# Patient Record
Sex: Male | Born: 1980 | Race: White | Hispanic: No | Marital: Single | Smoking: Current every day smoker
Health system: Southern US, Community
[De-identification: ages and names within clinical notes are randomized; demographics above are authoritative.]

## PROBLEM LIST (undated history)

## (undated) ENCOUNTER — Emergency Department (HOSPITAL_COMMUNITY): Admission: EM | Payer: Self-pay | Source: Home / Self Care

## (undated) DIAGNOSIS — F329 Major depressive disorder, single episode, unspecified: Secondary | ICD-10-CM

## (undated) DIAGNOSIS — F32A Depression, unspecified: Secondary | ICD-10-CM

## (undated) HISTORY — PX: BRAIN TUMOR EXCISION: SHX577

---

## 2005-05-08 ENCOUNTER — Emergency Department (HOSPITAL_COMMUNITY): Admission: EM | Admit: 2005-05-08 | Discharge: 2005-05-08 | Payer: Self-pay | Admitting: Emergency Medicine

## 2005-10-23 ENCOUNTER — Emergency Department (HOSPITAL_COMMUNITY): Admission: EM | Admit: 2005-10-23 | Discharge: 2005-10-23 | Payer: Self-pay | Admitting: Emergency Medicine

## 2006-02-01 ENCOUNTER — Emergency Department (HOSPITAL_COMMUNITY): Admission: EM | Admit: 2006-02-01 | Discharge: 2006-02-02 | Payer: Self-pay | Admitting: Emergency Medicine

## 2006-02-09 ENCOUNTER — Emergency Department (HOSPITAL_COMMUNITY): Admission: EM | Admit: 2006-02-09 | Discharge: 2006-02-09 | Payer: Self-pay | Admitting: Emergency Medicine

## 2006-02-12 ENCOUNTER — Emergency Department (HOSPITAL_COMMUNITY): Admission: EM | Admit: 2006-02-12 | Discharge: 2006-02-12 | Payer: Self-pay | Admitting: Emergency Medicine

## 2006-07-04 ENCOUNTER — Emergency Department (HOSPITAL_COMMUNITY): Admission: EM | Admit: 2006-07-04 | Discharge: 2006-07-05 | Payer: Self-pay | Admitting: Emergency Medicine

## 2006-07-05 ENCOUNTER — Emergency Department (HOSPITAL_COMMUNITY): Admission: EM | Admit: 2006-07-05 | Discharge: 2006-07-05 | Payer: Self-pay | Admitting: Emergency Medicine

## 2006-09-08 ENCOUNTER — Emergency Department (HOSPITAL_COMMUNITY): Admission: EM | Admit: 2006-09-08 | Discharge: 2006-09-08 | Payer: Self-pay | Admitting: Emergency Medicine

## 2010-12-23 LAB — CBC
Hemoglobin: 16.9
MCHC: 34.7
MCV: 91.4
RDW: 13.1

## 2010-12-23 LAB — BASIC METABOLIC PANEL
CO2: 26
Calcium: 9.1
Creatinine, Ser: 1.38
Glucose, Bld: 121 — ABNORMAL HIGH
Sodium: 138

## 2010-12-23 LAB — DIFFERENTIAL
Basophils Absolute: 0
Basophils Relative: 0
Eosinophils Absolute: 0.2
Monocytes Absolute: 0.7
Neutro Abs: 5.5

## 2010-12-23 LAB — RAPID URINE DRUG SCREEN, HOSP PERFORMED: Cocaine: POSITIVE — AB

## 2014-07-29 ENCOUNTER — Emergency Department (HOSPITAL_COMMUNITY)
Admission: EM | Admit: 2014-07-29 | Discharge: 2014-07-29 | Disposition: A | Discharge status: Home / Self Care | Payer: Self-pay | Attending: Emergency Medicine | Admitting: Emergency Medicine

## 2014-07-29 ENCOUNTER — Encounter (HOSPITAL_COMMUNITY): Payer: Self-pay | Admitting: Emergency Medicine

## 2014-07-29 ENCOUNTER — Emergency Department (HOSPITAL_COMMUNITY): Payer: Self-pay

## 2014-07-29 DIAGNOSIS — I10 Essential (primary) hypertension: Secondary | ICD-10-CM | POA: Insufficient documentation

## 2014-07-29 DIAGNOSIS — F419 Anxiety disorder, unspecified: Secondary | ICD-10-CM | POA: Insufficient documentation

## 2014-07-29 DIAGNOSIS — G479 Sleep disorder, unspecified: Secondary | ICD-10-CM | POA: Insufficient documentation

## 2014-07-29 DIAGNOSIS — Z720 Tobacco use: Secondary | ICD-10-CM | POA: Insufficient documentation

## 2014-07-29 LAB — RAPID URINE DRUG SCREEN, HOSP PERFORMED
Amphetamines: POSITIVE — AB
Barbiturates: NOT DETECTED
Benzodiazepines: NOT DETECTED
Cocaine: NOT DETECTED
OPIATES: NOT DETECTED
Tetrahydrocannabinol: NOT DETECTED

## 2014-07-29 LAB — CBC
HCT: 45.7 % (ref 39.0–52.0)
Hemoglobin: 15.8 g/dL (ref 13.0–17.0)
MCH: 30.9 pg (ref 26.0–34.0)
MCHC: 34.6 g/dL (ref 30.0–36.0)
MCV: 89.4 fL (ref 78.0–100.0)
Platelets: 284 10*3/uL (ref 150–400)
RBC: 5.11 MIL/uL (ref 4.22–5.81)
RDW: 12.1 % (ref 11.5–15.5)
WBC: 10.9 10*3/uL — AB (ref 4.0–10.5)

## 2014-07-29 LAB — URINALYSIS, ROUTINE W REFLEX MICROSCOPIC
BILIRUBIN URINE: NEGATIVE
Glucose, UA: NEGATIVE mg/dL
HGB URINE DIPSTICK: NEGATIVE
Ketones, ur: NEGATIVE mg/dL
LEUKOCYTES UA: NEGATIVE
NITRITE: NEGATIVE
PH: 6 (ref 5.0–8.0)
PROTEIN: NEGATIVE mg/dL
Specific Gravity, Urine: 1.017 (ref 1.005–1.030)
Urobilinogen, UA: 0.2 mg/dL (ref 0.0–1.0)

## 2014-07-29 LAB — BASIC METABOLIC PANEL
Anion gap: 8 (ref 5–15)
BUN: 12 mg/dL (ref 6–20)
CALCIUM: 9.2 mg/dL (ref 8.9–10.3)
CHLORIDE: 105 mmol/L (ref 101–111)
CO2: 24 mmol/L (ref 22–32)
CREATININE: 1.05 mg/dL (ref 0.61–1.24)
GFR calc non Af Amer: >60 mL/min (ref >60)
Glucose, Bld: 112 mg/dL — ABNORMAL HIGH (ref 65–99)
Potassium: 4.1 mmol/L (ref 3.5–5.1)
SODIUM: 137 mmol/L (ref 135–145)

## 2014-07-29 MED ORDER — LORAZEPAM 1 MG PO TABS
0.5000 mg | ORAL_TABLET | Freq: Once | ORAL | Status: AC
  Administered 2014-07-29: 0.5 mg via ORAL
  Filled 2014-07-29: qty 1

## 2014-07-29 NOTE — ED Notes (Signed)
Pt c/o feeling shaky past few days and BP is 148/107. Pt has not been diagnosed with hypertension. Pt has been under recent stress recently.

## 2014-07-29 NOTE — ED Provider Notes (Signed)
CSN: [id number]     Arrival date & time 07/29/14  1444 History   First MD Initiated Contact with Patient 07/29/14 1625     Chief Complaint  Patient presents with  . Shaking  . Hypertension     (Consider location/radiation/quality/duration/timing/severity/associated sxs/prior Treatment) HPI Comments: 3 day history of shaking in arms and legs and feeling anxious.  Patient went to [facility] and checked his blood pressure and it was 148/107. He denies any history of high blood pressure. He denies any headache, vision change, chest pain, shortness of breath. No nausea or vomiting. He says is been on a lot of stress recently and feeling anxious. Denies any suicidal or homicidal thoughts. Denies any medication or drug use. States he works in [employer] and drinks caffeine "nonstop". He does complain of pressure between his eyes for the past several days and nasal congestion. Denies any alcohol use.  Patient is a 34 y.o. male presenting with hypertension. The history is provided by the patient.  Hypertension Pertinent negatives include no chest pain, no abdominal pain and no shortness of breath.    History reviewed. No pertinent past medical history. History reviewed. No pertinent past surgical history. No family history on file. History  Substance Use Topics  . Smoking status: Current Every Day Smoker  . Smokeless tobacco: Not on file  . Alcohol Use: No    Review of Systems  Constitutional: Negative for fever, activity change and appetite change.  HENT: Negative for congestion and rhinorrhea.   Respiratory: Negative for cough, chest tightness and shortness of breath.   Cardiovascular: Negative for chest pain.  Gastrointestinal: Negative for nausea, vomiting and abdominal pain.  Genitourinary: Negative for dysuria, hematuria and testicular pain.  Musculoskeletal: Negative for myalgias and arthralgias.  Skin: Negative for wound.  Psychiatric/Behavioral: Positive for sleep disturbance,  dysphoric mood and decreased concentration. The patient is nervous/anxious.   A complete 10 system review of systems was obtained and all systems are negative except as noted in the HPI and PMH.      Allergies  Review of patient's allergies indicates no known allergies.  Home Medications   Prior to Admission medications   Medication Sig Start Date End Date Taking? Authorizing Provider  ibuprofen (ADVIL,MOTRIN) 200 MG tablet Take 600 mg by mouth every 6 (six) hours as needed for mild pain or moderate pain.   Yes Historical Provider, MD   BP 111/45 mmHg  Pulse 91  Temp(Src) 98.4 F (36.9 C) (Oral)  Resp 16  Ht  (1.88 m)  Wt 290 lb (131.543 kg)  BMI 37.22 kg/m2  SpO2 98% Physical Exam  Constitutional: He is oriented to person, place, and time. He appears well-developed and well-nourished. No distress.  Appears anxious, mildly tremulous.  HENT:  Head: Normocephalic and atraumatic.  Mouth/Throat: Oropharynx is clear and moist. No oropharyngeal exudate.  Eyes: Conjunctivae and EOM are normal. Pupils are equal, round, and reactive to light.  Neck: Normal range of motion. Neck supple.  No meningismus.  Cardiovascular: Normal rate, regular rhythm, normal heart sounds and intact distal pulses.   No murmur heard. Pulmonary/Chest: Effort normal and breath sounds normal. No respiratory distress.  Abdominal: Soft. There is no tenderness. There is no rebound and no guarding.  Musculoskeletal: Normal range of motion. He exhibits no edema or tenderness.  Neurological: He is alert and oriented to person, place, and time. No cranial nerve deficit. He exhibits normal muscle tone. Coordination normal.  No ataxia on finger to nose bilaterally. No  pronator drift. 5/5 strength throughout. CN 2-12 intact. Negative Romberg. Equal grip strength. Sensation intact. Gait is normal.   Skin: Skin is warm.  Psychiatric: He has a normal mood and affect. His behavior is normal.  Nursing note and vitals  reviewed.   ED Course  Procedures (including critical care time) Labs Review Labs Reviewed  CBC - Abnormal; Notable for the following:    WBC 10.9 (*)    All other components within normal limits  BASIC METABOLIC PANEL - Abnormal; Notable for the following:    Glucose, Bld 112 (*)    All other components within normal limits  URINE RAPID DRUG SCREEN (HOSP PERFORMED) - Abnormal; Notable for the following:    Amphetamines POSITIVE (*)    All other components within normal limits  URINALYSIS, ROUTINE W REFLEX MICROSCOPIC    Imaging Review Dg Chest 2 View  07/29/2014   CLINICAL DATA:  Bilateral hand shakiness, hypertension  EXAM: CHEST  2 VIEW  COMPARISON:  07/13/2014  FINDINGS: Lungs are clear.  No pleural effusion or pneumothorax.  The heart is normal in size.  Visualized osseous structures are within normal limits.  IMPRESSION: Normal chest radiographs.   Electronically Signed   By: Charline BillsSriyesh  Krishnan M.D.   On: 07/29/2014 17:28     EKG Interpretation   Date/Time:  Sunday Jul 29 2014 16:33:33 EDT Ventricular Rate:  87 PR Interval:  141 QRS Duration: 95 QT Interval:  376 QTC Calculation: 452 R Axis:   41 Text Interpretation:  Sinus rhythm No previous ECGs available Confirmed by  Braileigh Landenberger  MD, Samyak Sackmann [phone]([id number] on 07/29/2014 4:52:29 PM      MDM   Final diagnoses:  Anxiety   patient was shaking hands and feet and elevated blood pressure. No chest pain or shortness of breath. No headache.  Labs unremarkable. Chest x-ray negative. EKG normal sinus rhythm.  Patient feels improved after Ativan in the ED. Denies any SI or HI. UDS positive for amphetamines which he denies.  Instructed to gradually decrease his caffeine use. Needs PCP follow-up if anxiety continues to be an ongoing issue.   Glynn OctaveStephen Edel Rivero, MD 07/30/14 [phone]

## 2014-07-29 NOTE — Discharge Instructions (Signed)
Generalized Anxiety Disorder Decrease your caffeine use gradually. Follow up with your doctor. Return to the ED if you develop new or worsening symptoms. Generalized anxiety disorder (GAD) is a mental disorder. It interferes with life functions, including relationships, work, and school. GAD is different from normal anxiety, which everyone experiences at some point in their lives in response to specific life events and activities. Normal anxiety actually helps us prepare for and get through these life events and activities. Normal anxiety goes away after the event or activity is over.  GAD causes anxiety that is not necessarily related to specific events or activities. It also causes excess anxiety in proportion to specific events or activities. The anxiety associated with GAD is also difficult to control. GAD can vary from mild to severe. People with severe GAD can have intense waves of anxiety with physical symptoms (panic attacks).  SYMPTOMS The anxiety and worry associated with GAD are difficult to control. This anxiety and worry are related to many life events and activities and also occur more days than not for 6 months or longer. People with GAD also have three or more of the following symptoms (one or more in children):  Restlessness.   Fatigue.  Difficulty concentrating.   Irritability.  Muscle tension.  Difficulty sleeping or unsatisfying sleep. DIAGNOSIS GAD is diagnosed through an assessment by your health care provider. Your health care provider will ask you questions aboutyour mood,physical symptoms, and events in your life. Your health care provider may ask you about your medical history and use of alcohol or drugs, including prescription medicines. Your health care provider may also do a physical exam and blood tests. Certain medical conditions and the use of certain substances can cause symptoms similar to those associated with GAD. Your health care provider may refer you to  a mental health specialist for further evaluation. TREATMENT The following therapies are usually used to treat GAD:   Medication. Antidepressant medication usually is prescribed for long-term daily control. Antianxiety medicines may be added in severe cases, especially when panic attacks occur.   Talk therapy (psychotherapy). Certain types of talk therapy can be helpful in treating GAD by providing support, education, and guidance. A form of talk therapy called cognitive behavioral therapy can teach you healthy ways to think about and react to daily life events and activities.  Stress managementtechniques. These include yoga, meditation, and exercise and can be very helpful when they are practiced regularly. A mental health specialist can help determine which treatment is best for you. Some people see improvement with one therapy. However, other people require a combination of therapies. Document Released: 06/20/2012 Document Revised: 07/10/2013 Document Reviewed: 06/20/2012 [facility] Patient Information 2015 [place], [place]. This information is not intended to replace advice given to you by your health care provider. Make sure you discuss any questions you have with your health care provider.

## 2014-07-30 ENCOUNTER — Emergency Department (HOSPITAL_COMMUNITY)
Admission: EM | Admit: 2014-07-30 | Discharge: 2014-07-30 | Disposition: A | Discharge status: Home / Self Care | Payer: Self-pay | Attending: Emergency Medicine | Admitting: Emergency Medicine

## 2014-07-30 ENCOUNTER — Encounter (HOSPITAL_COMMUNITY): Payer: Self-pay | Admitting: Family Medicine

## 2014-07-30 DIAGNOSIS — F419 Anxiety disorder, unspecified: Secondary | ICD-10-CM | POA: Insufficient documentation

## 2014-07-30 DIAGNOSIS — R251 Tremor, unspecified: Secondary | ICD-10-CM | POA: Insufficient documentation

## 2014-07-30 DIAGNOSIS — Z720 Tobacco use: Secondary | ICD-10-CM | POA: Insufficient documentation

## 2014-07-30 LAB — TSH: TSH: 1.038 u[IU]/mL (ref 0.350–4.500)

## 2014-07-30 MED ORDER — LORAZEPAM 0.5 MG PO TABS: 0.5000 mg | ORAL_TABLET | Freq: Two times a day (BID) | ORAL | Status: DC | PRN

## 2014-07-30 MED ORDER — LORAZEPAM 1 MG PO TABS
1.0000 mg | ORAL_TABLET | Freq: Once | ORAL | Status: AC
  Administered 2014-07-30: 1 mg via ORAL
  Filled 2014-07-30: qty 1

## 2014-07-30 NOTE — ED Provider Notes (Signed)
CSN: [id number]     Arrival date & time 07/30/14  1329 History   First MD Initiated Contact with Patient 07/30/14 1508     Chief Complaint  Patient presents with  . Anxiety     (Consider location/radiation/quality/duration/timing/severity/associated sxs/prior Treatment) HPI Comments: 34 year old male comes with complaints of anxiety. Was seen yesterday for similar. Had fairly extensive workup at that time. Determined patient with no organic issues. Given Ativan and improved. Returns today with similar. Points of generalized shaking and feeling of anxiety for several hours. His been constant and unchanged. He is taking no medications for this. Yesterday had a UDS which showed amphetamines. He endorses taking over-the-counter cold medications but adamantly denies any illegal drug use.  Patient is a 34 y.o. male presenting with anxiety.  Anxiety This is a new problem. The current episode started yesterday. The problem occurs daily. The problem has been unchanged. Pertinent negatives include no abdominal pain, chest pain, congestion, fever, headaches or rash. Nothing aggravates the symptoms. He has tried nothing for the symptoms.    History reviewed. No pertinent past medical history. History reviewed. No pertinent past surgical history. History reviewed. No pertinent family history. History  Substance Use Topics  . Smoking status: Current Every Day Smoker  . Smokeless tobacco: Not on file  . Alcohol Use: No    Review of Systems  Constitutional: Negative for fever.  HENT: Negative for congestion.   Respiratory: Negative for shortness of breath.   Cardiovascular: Negative for chest pain.  Gastrointestinal: Negative for abdominal pain.  Genitourinary: Negative for dysuria.  Skin: Negative for rash.  Neurological: Negative for headaches.  Psychiatric/Behavioral: The patient is nervous/anxious.   All other systems reviewed and are negative.     Allergies  Review of patient's  allergies indicates no known allergies.  Home Medications   Prior to Admission medications   Medication Sig Start Date End Date Taking? Authorizing Provider  ibuprofen (ADVIL,MOTRIN) 200 MG tablet Take 600 mg by mouth every 6 (six) hours as needed for mild pain or moderate pain.   Yes Historical Provider, MD  LORazepam (ATIVAN) 0.5 MG tablet Take 1 tablet (0.5 mg total) by mouth 2 (two) times daily as needed for anxiety. 07/30/14   Bridgett Larssonhris Zade Falkner, MD   BP 123/66 mmHg  Pulse 72  Temp(Src) 98.7 F (37.1 C) (Oral)  Resp 18  SpO2 95% Physical Exam  Constitutional: He appears well-developed.  HENT:  Head: Normocephalic and atraumatic.  Eyes: Pupils are equal, round, and reactive to light.  4mm reactive   Neck: Normal range of motion.  Cardiovascular:  Heart rate 90 similar exam. No murmurs rubs gallops  Pulmonary/Chest: Effort normal. No stridor. No respiratory distress.  Abdominal: Soft. He exhibits no distension. There is no tenderness.  Musculoskeletal: He exhibits no tenderness.  Neurological: No cranial nerve deficit. He exhibits normal muscle tone.  Slightly tremulous throughout bilateral upper extremities. GCS 15. Normal coordination. Cranial nerves normal.  Skin: Skin is warm.  Psychiatric: He has a normal mood and affect.  Endorses anxiety   Vitals reviewed.   ED Course  Procedures (including critical care time) Labs Review Labs Reviewed  TSH    Imaging Review Dg Chest 2 View  07/29/2014   CLINICAL DATA:  Bilateral hand shakiness, hypertension  EXAM: CHEST  2 VIEW  COMPARISON:  07/13/2014  FINDINGS: Lungs are clear.  No pleural effusion or pneumothorax.  The heart is normal in size.  Visualized osseous structures are within normal limits.  IMPRESSION: Normal chest  radiographs.   Electronically Signed   By: Charline Bills M.D.   On: 07/29/2014 17:28     EKG Interpretation None      MDM  34 year old male return visit from yesterday. Complaining of anxiety and  shaking. Patient on exam is not tachycardic or hypertensive. He has no concerning physical exam findings. He had a full workup yesterday which ruled out most organic causes of this. They did not obtain thyroid studies. TSH was obtained today and normal. He was given 0.5 mg Ativan by mouth with good resolution. Patient's anxiety and shakiness likely secondary to abrupt caffeine withdrawal. States he drank multiple energy drinks, soda and coffee. Had abruptly stopped possibly 48 hours ago. We have encouraged him to taper this slowly. He does have appropriate follow-up in 3 days to establish new PCP. UDS yesterday did show amphetamines however he endorses taking pseudoephedrine. He denies any illegal drug use. will provide several days of twice a day 0.5 mg Ativan.   Final diagnoses:  Anxiety        Bridgett Larsson, MD 07/30/14 1717  Nelva Nay, MD 07/30/14 [phone]

## 2014-07-30 NOTE — ED Notes (Signed)
Pt sts here for anxiety yesterday. sts he was given medication here and felt better. sts the anxiety returned today. sts he tried to contact the referral and he couldn't get an appt for a few days. sts he feels shaky

## 2014-09-08 ENCOUNTER — Emergency Department (HOSPITAL_COMMUNITY)
Admission: EM | Admit: 2014-09-08 | Discharge: 2014-09-08 | Discharge status: Against Medical Advice | Payer: Self-pay | Attending: Emergency Medicine | Admitting: Emergency Medicine

## 2014-09-08 ENCOUNTER — Encounter (HOSPITAL_COMMUNITY): Payer: Self-pay | Admitting: Emergency Medicine

## 2014-09-08 DIAGNOSIS — Z720 Tobacco use: Secondary | ICD-10-CM | POA: Insufficient documentation

## 2014-09-08 DIAGNOSIS — F329 Major depressive disorder, single episode, unspecified: Secondary | ICD-10-CM | POA: Insufficient documentation

## 2014-09-08 DIAGNOSIS — F32A Depression, unspecified: Secondary | ICD-10-CM

## 2014-09-08 HISTORY — DX: Major depressive disorder, single episode, unspecified: F32.9

## 2014-09-08 HISTORY — DX: Depression, unspecified: F32.A

## 2014-09-08 NOTE — ED Notes (Addendum)
Awake. Verbally responsive. Resp even and unlabored. ABC's intact. No behavior problems noted. NAD noted. Pt given breakfast tray and eaten 90% of meal.
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
## 2014-09-08 NOTE — ED Notes (Signed)
Bed: WA30 Expected date:  Expected time:  Means of arrival:  Comments: 

## 2014-09-08 NOTE — ED Notes (Signed)
Awake, watching TV. Verbally responsive. Resp even and unlabored. ABC's intact. No behavior problems noted. NAD noted.

## 2014-09-08 NOTE — ED Notes (Signed)
Awake. Verbally responsive. Resp even and unlabored. ABC's intact. No behavior problems noted. NAD noted.

## 2014-09-08 NOTE — ED Notes (Addendum)
In room to check pt and pt was not able to be found.

## 2014-09-08 NOTE — ED Notes (Addendum)
Pt reported having symptoms of depression and insomnia x1 week. Pt reported that he was advise to stopped taking Prozac medication x1 month ago after taking for 675yrs. Pt resides in [facility] and receiving NA services. Pt denies SI/HI.

## 2014-09-08 NOTE — ED Notes (Signed)
Dr Cook at bedside

## 2014-09-08 NOTE — ED Provider Notes (Signed)
CSN: [id number]     Arrival date & time 09/08/14  0707 History   First MD Initiated Contact with Patient 09/08/14 [phone]     Chief Complaint  Patient presents with  . Depression     (Consider location/radiation/quality/duration/timing/severity/associated sxs/prior Treatment) HPI..... Patient complains of depression for many years. He had been on Prozac, but this was discontinued recently after 8 years. He has been sleeping poorly. 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 He is not taking any drugs. No alcohol. No suicidal or homicidal ideation.  Past Medical History  Diagnosis Date  . Depression    History reviewed. No pertinent past surgical history. No family history on file. History  Substance Use Topics  . Smoking status: Current Every Day Smoker  . Smokeless tobacco: Not on file  . Alcohol Use: No    Review of Systems  All other systems reviewed and are negative.     Allergies  Review of patient's allergies indicates no known allergies.  Home Medications   Prior to Admission medications   Medication Sig Start Date End Date Taking? Authorizing Provider  LORazepam (ATIVAN) 0.5 MG tablet Take 1 tablet (0.5 mg total) by mouth 2 (two) times daily as needed for anxiety. Patient not taking: Reported on 09/08/2014 07/30/14   Bridgett Larssonhris Post, MD   BP 130/76 mmHg  Pulse 106  Temp(Src) 98 F (36.7 C) (Oral)  Resp 18  SpO2 99% Physical Exam  Constitutional: He is oriented to person, place, and time. He appears well-developed and well-nourished.  HENT:  Head: Normocephalic and atraumatic.  Eyes: Conjunctivae and EOM are normal. Pupils are equal, round, and reactive to light.  Neck: Normal range of motion. Neck supple.  Cardiovascular: Normal rate and regular rhythm.   Pulmonary/Chest: Effort normal and breath sounds normal.  Abdominal: Soft. Bowel sounds are normal.  Musculoskeletal: Normal range of motion.  Neurological: He is alert and  oriented to person, place, and time.  Skin: Skin is warm and dry.  Psychiatric:  Flat affect; depressed  Nursing note and vitals reviewed.   ED Course  Procedures (including critical care time) Labs Review Labs Reviewed - No data to display  Imaging Review No results found.   EKG Interpretation None      MDM   Final diagnoses:  Depression    Patient is depressed, but not psychotic. No suicidal or homicidal ideation. Will consult behavioral health.    Donnetta HutchingBrian Esmeralda Blanford, MD 09/08/14 1014

## 2014-10-17 ENCOUNTER — Emergency Department (HOSPITAL_COMMUNITY)
Admission: EM | Admit: 2014-10-17 | Discharge: 2014-10-17 | Disposition: A | Discharge status: Home / Self Care | Payer: Self-pay | Attending: Emergency Medicine | Admitting: Emergency Medicine

## 2014-10-17 ENCOUNTER — Encounter (HOSPITAL_COMMUNITY): Payer: Self-pay | Admitting: Emergency Medicine

## 2014-10-17 ENCOUNTER — Emergency Department (HOSPITAL_COMMUNITY)
Admission: EM | Admit: 2014-10-17 | Discharge: 2014-10-18 | Disposition: A | Discharge status: Home / Self Care | Payer: Self-pay | Attending: Emergency Medicine | Admitting: Emergency Medicine

## 2014-10-17 ENCOUNTER — Encounter (HOSPITAL_COMMUNITY): Payer: Self-pay | Admitting: *Deleted

## 2014-10-17 DIAGNOSIS — M542 Cervicalgia: Secondary | ICD-10-CM | POA: Insufficient documentation

## 2014-10-17 DIAGNOSIS — T8484XA Pain due to internal orthopedic prosthetic devices, implants and grafts, initial encounter: Secondary | ICD-10-CM | POA: Insufficient documentation

## 2014-10-17 DIAGNOSIS — R062 Wheezing: Secondary | ICD-10-CM | POA: Insufficient documentation

## 2014-10-17 DIAGNOSIS — F419 Anxiety disorder, unspecified: Secondary | ICD-10-CM | POA: Insufficient documentation

## 2014-10-17 DIAGNOSIS — R6883 Chills (without fever): Secondary | ICD-10-CM | POA: Insufficient documentation

## 2014-10-17 DIAGNOSIS — R51 Headache: Secondary | ICD-10-CM | POA: Insufficient documentation

## 2014-10-17 DIAGNOSIS — Z8659 Personal history of other mental and behavioral disorders: Secondary | ICD-10-CM | POA: Insufficient documentation

## 2014-10-17 DIAGNOSIS — Z720 Tobacco use: Secondary | ICD-10-CM | POA: Insufficient documentation

## 2014-10-17 DIAGNOSIS — H5710 Ocular pain, unspecified eye: Secondary | ICD-10-CM | POA: Insufficient documentation

## 2014-10-17 DIAGNOSIS — K029 Dental caries, unspecified: Secondary | ICD-10-CM | POA: Insufficient documentation

## 2014-10-17 DIAGNOSIS — Y831 Surgical operation with implant of artificial internal device as the cause of abnormal reaction of the patient, or of later complication, without mention of misadventure at the time of the procedure: Secondary | ICD-10-CM | POA: Insufficient documentation

## 2014-10-17 DIAGNOSIS — K002 Abnormalities of size and form of teeth: Secondary | ICD-10-CM | POA: Insufficient documentation

## 2014-10-17 DIAGNOSIS — T85848D Pain due to other internal prosthetic devices, implants and grafts, subsequent encounter: Secondary | ICD-10-CM

## 2014-10-17 MED ORDER — AMOXICILLIN 500 MG PO CAPS
500.0000 mg | ORAL_CAPSULE | Freq: Once | ORAL | Status: AC
  Administered 2014-10-17: 500 mg via ORAL
  Filled 2014-10-17: qty 1

## 2014-10-17 MED ORDER — HYDROCODONE-ACETAMINOPHEN 5-325 MG PO TABS
1.0000 | ORAL_TABLET | Freq: Once | ORAL | Status: AC
  Administered 2014-10-17: 1 via ORAL
  Filled 2014-10-17: qty 1

## 2014-10-17 MED ORDER — AMOXICILLIN 500 MG PO CAPS: 500.0000 mg | ORAL_CAPSULE | Freq: Three times a day (TID) | ORAL | Status: DC

## 2014-10-17 MED ORDER — NAPROXEN 500 MG PO TABS: 500.0000 mg | ORAL_TABLET | Freq: Two times a day (BID) | ORAL | Status: DC

## 2014-10-17 NOTE — ED Notes (Addendum)
Pt c/o tooth pain/infection x 2 weeks. States he thinks he needs antibiotics.  States he took a few leftover penicillins the other day but does not think they work.

## 2014-10-17 NOTE — Discharge Instructions (Signed)
Dental Caries °Dental caries is tooth decay. This decay can cause a hole in teeth (cavity) that can get bigger and deeper over time. °HOME CARE °· Brush and floss your teeth. Do this at least two times a day. °· Use a fluoride toothpaste. °· Use a mouth rinse if told by your dentist or doctor. °· Eat less sugary and starchy foods. Drink less sugary drinks. °· Avoid snacking often on sugary and starchy foods. Avoid sipping often on sugary drinks. °· Keep regular checkups and cleanings with your dentist. °· Use fluoride supplements if told by your dentist or doctor. °· Allow fluoride to be applied to teeth if told by your dentist or doctor. °Document Released: 12/03/2007 Document Revised: 07/10/2013 Document Reviewed: 02/26/2012 °ExitCare® Patient Information ©2015 [employer]. This information is not intended to replace advice given to you by your health care provider. Make sure you discuss any questions you have with your health care provider. ° °Dental Pain °A tooth ache may be caused by cavities (tooth decay). Cavities expose the nerve of the tooth to air and hot or cold temperatures. It may come from an infection or abscess (also called a boil or furuncle) around your tooth. It is also often caused by dental caries (tooth decay). This causes the pain you are having. °DIAGNOSIS  °Your caregiver can diagnose this problem by exam. °TREATMENT  °· If caused by an infection, it may be treated with medications which kill germs (antibiotics) and pain medications as prescribed by your caregiver. Take medications as directed. °· Only take over-the-counter or prescription medicines for pain, discomfort, or fever as directed by your caregiver. °· Whether the tooth ache today is caused by infection or dental disease, you should see your dentist as soon as possible for further care. °SEEK MEDICAL CARE IF: °The exam and treatment you received today has been provided on an emergency basis only. This is not a substitute for  complete medical or dental care. If your problem worsens or new problems (symptoms) appear, and you are unable to meet with your dentist, call or return to this location. °SEEK IMMEDIATE MEDICAL CARE IF:  °· You have a fever. °· You develop redness and swelling of your face, jaw, or neck. °· You are unable to open your mouth. °· You have severe pain uncontrolled by pain medicine. °MAKE SURE YOU:  °· Understand these instructions. °· Will watch your condition. °· Will get help right away if you are not doing well or get worse. °Document Released: 02/23/2005 Document Revised: 05/18/2011 Document Reviewed: 10/12/2007 °ExitCare® Patient Information ©2015 [employer]. This information is not intended to replace advice given to you by your health care provider. Make sure you discuss any questions you have with your health care provider. ° °

## 2014-10-17 NOTE — ED Provider Notes (Signed)
CSN: [id number]     Arrival date & time 10/17/14  1749 History  This chart was scribed for non-physician practitioner, Kerrie Buffalo, NP, working with Lyndal Pulley, MD, by Ronney Lion, ED Scribe. This patient was seen in room TR07C/TR07C and the patient's care was started at 6:08 PM.    Chief Complaint  Patient presents with  . Dental Pain   Patient is a 34 y.o. male presenting with tooth pain. The history is provided by the patient. No language interpreter was used.  Dental Pain Location:  Upper and lower Upper teeth location:  3/RU 1st molar Lower teeth location:  31/RL 2nd molar Quality:  Aching Severity:  Severe Onset quality:  Gradual Duration:  2 weeks Timing:  Constant Progression:  Worsening Chronicity:  Recurrent Relieved by:  NSAIDs Worsened by:  Nothing tried Ineffective treatments:  None tried Associated symptoms: no facial swelling and no fever   Risk factors: lack of dental care and smoking     HPI Comments: Delwyn Scoggin Menn is a 34 y.o. male who presents to the Emergency Department complaining of constant, worsening, aching right upper and lower molar pain that began 2 weeks ago. Patient complains of associated neck pain, eye pain, chills, and throbbing facial pain. He reports a history of multiple dental infections and states this is how it feels whenever he gets an infection. Patient has been taking ibuprofen with minimal relief. Eating or drinking hot/cold foods exacerbates his pain. He states he does not have a dentist due to insurance issues. Patient is a PPD smoker. He denies fever or facial swelling. He has NKDA to antibiotics.   Past Medical History  Diagnosis Date  . Depression    History reviewed. No pertinent past surgical history. No family history on file. Social History  Substance Use Topics  . Smoking status: Current Every Day Smoker  . Smokeless tobacco: None  . Alcohol Use: No    Review of Systems  Constitutional: Positive for chills. Negative  for fever.  HENT: Positive for dental problem. Negative for facial swelling.        Facial pain  All other systems reviewed and are negative.    Allergies  Review of patient's allergies indicates no known allergies.  Home Medications   Prior to Admission medications   Medication Sig Start Date End Date Taking? Authorizing Provider  amoxicillin (AMOXIL) 500 MG capsule Take 1 capsule (500 mg total) by mouth 3 (three) times daily. 10/17/14   Admir Candelas Orlene Och, NP  LORazepam (ATIVAN) 0.5 MG tablet Take 1 tablet (0.5 mg total) by mouth 2 (two) times daily as needed for anxiety. Patient not taking: Reported on 09/08/2014 07/30/14   Bridgett Larsson, MD  naproxen (NAPROSYN) 500 MG tablet Take 1 tablet (500 mg total) by mouth 2 (two) times daily. 10/17/14   Ka Bench Orlene Och, NP   BP 145/90 mmHg  Pulse 100  Temp(Src) 98.3 F (36.8 C) (Oral)  Resp 18  SpO2 97% Physical Exam  Constitutional: He is oriented to person, place, and time. He appears well-developed and well-nourished. No distress.  HENT:  Head: Normocephalic and atraumatic.  Bottom second molar with decay and swelling and erythema of surrounding gingiva.  Upper first molar with decay and erythema of surrounding gingiva. Facial tenderness on the right.   Eyes: Conjunctivae and EOM are normal.  Neck: Neck supple. No tracheal deviation present.  Small anterior cervical node that is palpable.   Cardiovascular: Normal rate, regular rhythm and normal heart sounds.  Exam  reveals no gallop and no friction rub.   No murmur heard. Pulmonary/Chest: Effort normal. No respiratory distress. He has wheezes (inspiratory wheezes bilaterally due to smoking). He has no rales.  Musculoskeletal: Normal range of motion.  Lymphadenopathy:    He has cervical adenopathy.  Neurological: He is alert and oriented to person, place, and time.  Skin: Skin is warm and dry.  Psychiatric: He has a normal mood and affect. His behavior is normal.  Nursing note and vitals  reviewed.   ED Course  Procedures (including critical care time)  DIAGNOSTIC STUDIES: Oxygen Saturation is 97%% on R/A  COORDINATION OF CARE: 6:13 PM - Discussed treatment plan with pt at bedside which includes Rx antibiotics and non-narcotic pain medication, and referral to dentist, and pt agreed to plan. Patient states he is not driving home today, so we will administer one dose of pain medication here.   MDM  34 y.o. male with dental pain due to caries stable for d/c without fever and does not appear toxic. Information for dental clinics and follow up discussed with the patient and packet of information given. Will start antibiotics and NSAIDS. He will call the dentist for follow up.  Final diagnoses:  Pain due to dental caries   I personally performed the services described in this documentation, which was scribed in my presence. The recorded information has been reviewed and is accurate.     [address] [place], NP 10/17/14 Luiz Iron  Lyndal Pulley, MD 10/18/14 [phone]

## 2014-10-17 NOTE — ED Notes (Signed)
Pt. reports persistent right upper and lower molar pain for 2 weeks seen here today prescribed with antibiotic and pain medication but unable to purchase due to financial constraints .

## 2014-10-18 MED ORDER — BUPIVACAINE-EPINEPHRINE (PF) 0.5% -1:200000 IJ SOLN
1.8000 mL | Freq: Once | INTRAMUSCULAR | Status: AC
  Administered 2014-10-18: 1.8 mL
  Filled 2014-10-18: qty 1.8

## 2014-10-18 MED ORDER — DIAZEPAM 5 MG/ML IJ SOLN
5.0000 mg | Freq: Once | INTRAMUSCULAR | Status: AC
  Administered 2014-10-18: 5 mg via INTRAMUSCULAR
  Filled 2014-10-18: qty 2

## 2014-10-18 MED ORDER — AMOXICILLIN 500 MG PO CAPS
500.0000 mg | ORAL_CAPSULE | Freq: Once | ORAL | Status: AC
  Administered 2014-10-18: 500 mg via ORAL
  Filled 2014-10-18: qty 1

## 2014-10-18 MED ORDER — TRAMADOL HCL 50 MG PO TABS
50.0000 mg | ORAL_TABLET | Freq: Once | ORAL | Status: AC
  Administered 2014-10-18: 50 mg via ORAL
  Filled 2014-10-18: qty 1

## 2014-10-18 NOTE — ED Provider Notes (Signed)
CSN: [id number]     Arrival date & time 10/17/14  2255 History   First MD Initiated Contact with Patient 10/17/14 2321     Chief Complaint  Patient presents with  . Dental Pain     (Consider location/radiation/quality/duration/timing/severity/associated sxs/prior Treatment) HPI   Patient was seen and evaluated for dental pain in this same ER today at 6 PM. He checked back in at 11:03 PM. Patient is extremely agitated due to his dental pain which was unrelieved with the narcotic pain medicine he received earlier tonight. He states that his pain was relieved for only about 30 minutes and that he cannot stand the pain. It is radiating from his upper and lower right jaw into his ear and head and down his neck. He is also concerned that he is unable to fill the prescriptions given to him until her morning when he gets money. He is worried "the infection will continue to spread."  He is unable to afford a dentist, stating he has called many and they will not treat him unless he is able to pay full price and cash ranging from 700 - $1200.  He just wants to go to sleep tonight. He denies any facial swelling, fever, difficulty opening or closing his mouth, speaking, breathing or swallowing.  Past Medical History  Diagnosis Date  . Depression    History reviewed. No pertinent past surgical history. No family history on file. Social History  Substance Use Topics  . Smoking status: Current Every Day Smoker  . Smokeless tobacco: None  . Alcohol Use: No    Review of Systems  Constitutional: Negative.   Respiratory: Negative.   Cardiovascular: Negative.   Gastrointestinal: Negative.   Musculoskeletal: Negative.   Skin: Negative.   Neurological: Positive for headaches. Negative for dizziness, facial asymmetry, light-headedness and numbness.  Psychiatric/Behavioral: Positive for agitation.      Allergies  Review of patient's allergies indicates no known allergies.  Home Medications    Prior to Admission medications   Medication Sig Start Date End Date Taking? Authorizing Provider  amoxicillin (AMOXIL) 500 MG capsule Take 1 capsule (500 mg total) by mouth 3 (three) times daily. 10/17/14   Hope Orlene Och, NP  LORazepam (ATIVAN) 0.5 MG tablet Take 1 tablet (0.5 mg total) by mouth 2 (two) times daily as needed for anxiety. Patient not taking: Reported on 09/08/2014 07/30/14   Bridgett Larsson, MD  naproxen (NAPROSYN) 500 MG tablet Take 1 tablet (500 mg total) by mouth 2 (two) times daily. 10/17/14   Hope Orlene Och, NP   BP 136/107 mmHg  Pulse 87  Temp(Src) 99 F (37.2 C) (Oral)  Resp 14  Ht  (1.88 m)  Wt 330 lb (149.687 kg)  BMI 42.35 kg/m2  SpO2 97% Physical Exam  Constitutional: He is oriented to person, place, and time. He appears well-developed and well-nourished. No distress.  HENT:  Head: Normocephalic and atraumatic.  Nose: Nose normal.  Mouth/Throat: Oropharynx is clear and moist and mucous membranes are normal. Mucous membranes are not pale, not dry and not cyanotic. No trismus in the jaw. Abnormal dentition. Dental caries present. No dental abscesses or uvula swelling. No oropharyngeal exudate, posterior oropharyngeal edema or posterior oropharyngeal erythema.  Bottom second molar with decay and swelling and erythema of surrounding gingiva.  Upper first molar with decay and erythema of surrounding gingiva.  Multiple missing teeth No periapical abscess Generalized tenderness to palpation right side of face, without edema, erythema No super lingual tenderness to  palpation  Eyes: Conjunctivae and EOM are normal. Pupils are equal, round, and reactive to light. Right eye exhibits no discharge. Left eye exhibits no discharge. No scleral icterus.  Neck: Normal range of motion. No JVD present. No tracheal deviation present. No thyromegaly present.  Cardiovascular: Normal rate, regular rhythm, normal heart sounds and intact distal pulses.   Pulmonary/Chest: Effort normal and  breath sounds normal. No accessory muscle usage or stridor. No tachypnea. No respiratory distress.  Abdominal: Soft. Normal appearance and bowel sounds are normal. He exhibits no distension and no mass.  Musculoskeletal: Normal range of motion. He exhibits no edema or tenderness.  Lymphadenopathy:    He has no cervical adenopathy.  Neurological: He is alert and oriented to person, place, and time. He has normal reflexes. No cranial nerve deficit. He exhibits normal muscle tone. Coordination normal.  Skin: Skin is warm and dry. No rash noted. He is not diaphoretic. No erythema. No pallor.  Psychiatric: His speech is normal. Judgment and thought content normal. His mood appears anxious. He is agitated and hyperactive. Cognition and memory are normal.  Nursing note and vitals reviewed.      ED Course  Procedures (including critical care time) Labs Review Labs Reviewed - No data to display  NERVE BLOCK Date/Time: 10/18/14 Performed by: Unknown Foley Authorized by: Danelle Berry R Consent: Verbal consent obtained. Risks and benefits: risks, benefits and alternatives were discussed Consent given by: patient Indications: pain relief Body area: tooth # 31 & 3 Laterality: right Needle gauge: 27 G Local anesthetic: bupivacaine  Anesthetic total: 1.8 ml Outcome: pain improved Patient tolerance: Patient tolerated the procedure well with no immediate complications. Comments: Patient had complete relief of pain.     Imaging Review No results found.   EKG Interpretation None      MDM   Final diagnoses:  None    Dental pain, not relieved by pain meds given at earlier visit.  Pt extremely agitated. Given tramadol, valium and dental block with effective anesthesia. Patient appeared very comfortable when he discharged home.  He was previously given resources for dental care and prescriptions.  A second dose of antibiotics was given while he was here.  He was discharged home in  satisfactory condition.  Medications  traMADol (ULTRAM) tablet 50 mg (50 mg Oral Given 10/18/14 0126)  bupivacaine-epinephrine (MARCAINE W/ EPI) 0.5% -1:200000 injection 1.8 mL (1.8 mLs Infiltration Given 10/18/14 0126)  diazepam (VALIUM) injection 5 mg (5 mg Intramuscular Given 10/18/14 0148)  amoxicillin (AMOXIL) capsule 500 mg (500 mg Oral Given 10/18/14 0212)   Filed Vitals:   10/17/14 2302 10/18/14 0213  BP: 136/107 147/87  Pulse: 87 81  Temp: 99 F (37.2 C) 98.2 F (36.8 C)  TempSrc: Oral Oral  Resp: 14 20  Height: 6\' 2"  (1.88 m)   Weight: 330 lb (149.687 kg)   SpO2: 97% 97%         Danelle Berry, PA-C 10/18/14 0341  Derwood Kaplan, MD 10/18/14 2351

## 2014-11-20 ENCOUNTER — Encounter (HOSPITAL_COMMUNITY): Payer: Self-pay | Admitting: Emergency Medicine

## 2014-11-20 ENCOUNTER — Emergency Department (HOSPITAL_COMMUNITY): Payer: Self-pay

## 2014-11-20 ENCOUNTER — Emergency Department (HOSPITAL_COMMUNITY)
Admission: EM | Admit: 2014-11-20 | Discharge: 2014-11-20 | Disposition: A | Discharge status: Home / Self Care | Payer: Self-pay | Attending: Emergency Medicine | Admitting: Emergency Medicine

## 2014-11-20 DIAGNOSIS — R109 Unspecified abdominal pain: Secondary | ICD-10-CM | POA: Insufficient documentation

## 2014-11-20 DIAGNOSIS — Z791 Long term (current) use of non-steroidal anti-inflammatories (NSAID): Secondary | ICD-10-CM | POA: Insufficient documentation

## 2014-11-20 DIAGNOSIS — Z792 Long term (current) use of antibiotics: Secondary | ICD-10-CM | POA: Insufficient documentation

## 2014-11-20 DIAGNOSIS — Z8659 Personal history of other mental and behavioral disorders: Secondary | ICD-10-CM | POA: Insufficient documentation

## 2014-11-20 DIAGNOSIS — Z720 Tobacco use: Secondary | ICD-10-CM | POA: Insufficient documentation

## 2014-11-20 LAB — CBC WITH DIFFERENTIAL/PLATELET
BASOS ABS: 0 10*3/uL (ref 0.0–0.1)
BASOS PCT: 1 % (ref 0–1)
EOS PCT: 4 % (ref 0–5)
Eosinophils Absolute: 0.3 10*3/uL (ref 0.0–0.7)
HCT: 44.5 % (ref 39.0–52.0)
Hemoglobin: 15.1 g/dL (ref 13.0–17.0)
LYMPHS PCT: 31 % (ref 12–46)
Lymphs Abs: 2.2 10*3/uL (ref 0.7–4.0)
MCH: 31.1 pg (ref 26.0–34.0)
MCHC: 33.9 g/dL (ref 30.0–36.0)
MCV: 91.8 fL (ref 78.0–100.0)
Monocytes Absolute: 0.7 10*3/uL (ref 0.1–1.0)
Monocytes Relative: 10 % (ref 3–12)
NEUTROS ABS: 3.9 10*3/uL (ref 1.7–7.7)
Neutrophils Relative %: 54 % (ref 43–77)
PLATELETS: 235 10*3/uL (ref 150–400)
RBC: 4.85 MIL/uL (ref 4.22–5.81)
RDW: 12.5 % (ref 11.5–15.5)
WBC: 7.1 10*3/uL (ref 4.0–10.5)

## 2014-11-20 LAB — BASIC METABOLIC PANEL
ANION GAP: 8 (ref 5–15)
BUN: 12 mg/dL (ref 6–20)
CALCIUM: 8.4 mg/dL — AB (ref 8.9–10.3)
CO2: 23 mmol/L (ref 22–32)
Chloride: 107 mmol/L (ref 101–111)
Creatinine, Ser: 0.95 mg/dL (ref 0.61–1.24)
GLUCOSE: 118 mg/dL — AB (ref 65–99)
POTASSIUM: 3.9 mmol/L (ref 3.5–5.1)
Sodium: 138 mmol/L (ref 135–145)

## 2014-11-20 LAB — URINALYSIS, ROUTINE W REFLEX MICROSCOPIC
BILIRUBIN URINE: NEGATIVE
Glucose, UA: NEGATIVE mg/dL
Hgb urine dipstick: NEGATIVE
KETONES UR: NEGATIVE mg/dL
LEUKOCYTES UA: NEGATIVE
NITRITE: NEGATIVE
PROTEIN: NEGATIVE mg/dL
Specific Gravity, Urine: 1.03 (ref 1.005–1.030)
UROBILINOGEN UA: 0.2 mg/dL (ref 0.0–1.0)
pH: 6.5 (ref 5.0–8.0)

## 2014-11-20 MED ORDER — KETOROLAC TROMETHAMINE 30 MG/ML IJ SOLN: 30.0000 mg | Freq: Once | INTRAMUSCULAR | Status: DC

## 2014-11-20 MED ORDER — NAPROXEN 500 MG PO TABS: 500.0000 mg | ORAL_TABLET | Freq: Two times a day (BID) | ORAL | Status: DC

## 2014-11-20 MED ORDER — KETOROLAC TROMETHAMINE 60 MG/2ML IM SOLN
60.0000 mg | Freq: Once | INTRAMUSCULAR | Status: AC
  Administered 2014-11-20: 60 mg via INTRAMUSCULAR
  Filled 2014-11-20: qty 2

## 2014-11-20 NOTE — ED Notes (Signed)
Pt reports pain in Left flank area that started last Friday as "regular back pain", pt reports pain began radiating around side and into L groin on Friday.  Pt reports pain worse with standing up from sitting or lying or with straining.  Pt reports pain with standing.  Pt reports urine darker than normal, denies dysuria.

## 2014-11-20 NOTE — ED Provider Notes (Signed)
CSN: [id number]     Arrival date & time 11/20/14  8295 History   First MD Initiated Contact with Patient 11/20/14 [phone]     Chief Complaint  Patient presents with  . Flank Pain     (Consider location/radiation/quality/duration/timing/severity/associated sxs/prior Treatment) The history is provided by the patient and medical records.   34 y.o. M with hx of depression and kidney stones, presenting to the ED for left flank pain.  Patient states this initially began approx 4-5 days ago, described as a dull ache in his left lower back at that time.  States since then pain has intensified and is now radiating around to the left lower abdomen and into the groin.  Denies testicle pain.  No dysuria.  States urine does appear darker than normal, unsure if it is blood.  No fever, chills, sweats.  No prior abdominal surgeries.  States last bout with kidney stone was 7 years ago or more.  Patient states he does not drink water, he does drink a lot of Mt. Dew.  No hx of back problems or surgeries.  No numbness/weakness of lower extremities.  No bowel/bladder dysfunction.  VSS.  Past Medical History  Diagnosis Date  . Depression    History reviewed. No pertinent past surgical history. No family history on file. Social History  Substance Use Topics  . Smoking status: Current Every Day Smoker -- 1.00 packs/day  . Smokeless tobacco: Never Used  . Alcohol Use: No    Review of Systems  Gastrointestinal: Positive for abdominal pain.  Genitourinary: Positive for flank pain.  All other systems reviewed and are negative.     Allergies  Review of patient's allergies indicates no known allergies.  Home Medications   Prior to Admission medications   Medication Sig Start Date End Date Taking? Authorizing Provider  amoxicillin (AMOXIL) 500 MG capsule Take 1 capsule (500 mg total) by mouth 3 (three) times daily. 10/17/14   Hope Orlene Och, NP  LORazepam (ATIVAN) 0.5 MG tablet Take 1 tablet (0.5 mg total) by  mouth 2 (two) times daily as needed for anxiety. Patient not taking: Reported on 09/08/2014 07/30/14   Bridgett Larsson, MD  naproxen (NAPROSYN) 500 MG tablet Take 1 tablet (500 mg total) by mouth 2 (two) times daily. 10/17/14   Hope Orlene Och, NP   BP 113/67 mmHg  Pulse 65  Temp(Src) 97.7 F (36.5 C) (Oral)  Resp 16  Ht 6\' 2"  (1.88 m)  Wt 300 lb (136.079 kg)  BMI 38.50 kg/m2  SpO2 96%   Physical Exam  Constitutional: He is oriented to person, place, and time. He appears well-developed and well-nourished. No distress.  HENT:  Head: Normocephalic and atraumatic.  Mouth/Throat: Oropharynx is clear and moist.  Eyes: Conjunctivae and EOM are normal. Pupils are equal, round, and reactive to light.  Neck: Normal range of motion. Neck supple.  Cardiovascular: Normal rate, regular rhythm and normal heart sounds.   Pulmonary/Chest: Effort normal and breath sounds normal. No respiratory distress. He has no wheezes.  Abdominal: Soft. Bowel sounds are normal. There is no tenderness. There is CVA tenderness (mild, left). There is no guarding.  Musculoskeletal: Normal range of motion. He exhibits no edema.  Neurological: He is alert and oriented to person, place, and time.  Skin: Skin is warm and dry. He is not diaphoretic.  Psychiatric: He has a normal mood and affect.  Nursing note and vitals reviewed.   ED Course  Procedures (including critical care time) Labs Review Labs  Reviewed  BASIC METABOLIC PANEL - Abnormal; Notable for the following:    Glucose, Bld 118 (*)    Calcium 8.4 (*)    All other components within normal limits  CBC WITH DIFFERENTIAL/PLATELET  URINALYSIS, ROUTINE W REFLEX MICROSCOPIC (NOT AT [facility])    Imaging Review Ct Renal Stone Study  11/20/2014   CLINICAL DATA:  Left flank pain, left groin pain  EXAM: CT ABDOMEN AND PELVIS WITHOUT CONTRAST  TECHNIQUE: Multidetector CT imaging of the abdomen and pelvis was performed following the standard protocol without IV contrast.   COMPARISON:  None.  FINDINGS: Lung bases are unremarkable. Sagittal images of the spine shows mild degenerative changes lumbar spine unenhanced liver shows no biliary ductal dilatation. No calcified gallstones are noted within gallbladder. Unenhanced pancreas, spleen and adrenal glands are unremarkable. Unenhanced kidneys are symmetrical in size. No hydronephrosis or hydroureter. No nephrolithiasis. No calcified ureteral calculi. No aortic aneurysm.  No small bowel obstruction. No ascites or free air. No adenopathy. There is no pericecal inflammation. Normal appendix is noted in axial image 54. No distal colonic obstruction. There is no evidence of colitis or diverticulitis. Prostate gland and seminal vesicles are unremarkable. No inguinal adenopathy. No calcified calculi are noted within urinary bladder.  IMPRESSION: 1. No nephrolithiasis.  No hydronephrosis or hydroureter. 2. Normal appendix.  No pericecal inflammation. 3. No calcified ureteral calculi. 4. No calcified calculi are noted within bladder. 5. No small bowel obstruction.   Electronically Signed   By: Natasha Mead M.D.   On: 11/20/2014 08:20   I have personally reviewed and evaluated these images and lab results as part of my medical decision-making.   EKG Interpretation None      MDM   Final diagnoses:  Left flank pain   34 year old male here with left flank pain for the past several days. Remote history of kidney stones. Patient is afebrile, nontoxic.  Mild left CVA tenderness noted, remainder of abdominal exam is benign. UA without noted hematuria. Basic labs are reassuring. CT renal study without acute findings. No signs/sx concerning for acute spinal pathology.  Patient was treated here with Toradol, resting comfortably. Will discharge home with continued supportive care. He does not currently have a primary care physician, [facility].  Discussed plan with patient, he/she acknowledged  understanding and agreed with plan of care.  Return precautions given for new or worsening symptoms.  Garlon Hatchet, PA-C 11/20/14 1328  Kristen N Ward, DO 11/20/14 2333

## 2014-11-20 NOTE — Discharge Instructions (Signed)
Your work-up was normal today-- no stones noted on CT, no urinary infection. Please follow-up with your primary care physician.  If you do not have one, you may follow-up with the wellness clinic. Take the prescribed medication as directed. Return to the ED for new or worsening symptoms.

## 2014-11-20 NOTE — ED Notes (Signed)
Vella Kohler, PA, at bedside.

## 2015-01-11 ENCOUNTER — Emergency Department (HOSPITAL_COMMUNITY)
Admission: EM | Admit: 2015-01-11 | Discharge: 2015-01-11 | Disposition: A | Discharge status: Home / Self Care | Payer: Self-pay | Attending: Emergency Medicine | Admitting: Emergency Medicine

## 2015-01-11 ENCOUNTER — Encounter (HOSPITAL_COMMUNITY): Payer: Self-pay | Admitting: Emergency Medicine

## 2015-01-11 DIAGNOSIS — X58XXXA Exposure to other specified factors, initial encounter: Secondary | ICD-10-CM | POA: Insufficient documentation

## 2015-01-11 DIAGNOSIS — Y9289 Other specified places as the place of occurrence of the external cause: Secondary | ICD-10-CM | POA: Insufficient documentation

## 2015-01-11 DIAGNOSIS — S025XXB Fracture of tooth (traumatic), initial encounter for open fracture: Secondary | ICD-10-CM

## 2015-01-11 DIAGNOSIS — F17200 Nicotine dependence, unspecified, uncomplicated: Secondary | ICD-10-CM | POA: Insufficient documentation

## 2015-01-11 DIAGNOSIS — Y9389 Activity, other specified: Secondary | ICD-10-CM | POA: Insufficient documentation

## 2015-01-11 DIAGNOSIS — K002 Abnormalities of size and form of teeth: Secondary | ICD-10-CM | POA: Insufficient documentation

## 2015-01-11 DIAGNOSIS — Z8659 Personal history of other mental and behavioral disorders: Secondary | ICD-10-CM | POA: Insufficient documentation

## 2015-01-11 DIAGNOSIS — Z791 Long term (current) use of non-steroidal anti-inflammatories (NSAID): Secondary | ICD-10-CM | POA: Insufficient documentation

## 2015-01-11 DIAGNOSIS — Y999 Unspecified external cause status: Secondary | ICD-10-CM | POA: Insufficient documentation

## 2015-01-11 DIAGNOSIS — S025XXA Fracture of tooth (traumatic), initial encounter for closed fracture: Secondary | ICD-10-CM | POA: Insufficient documentation

## 2015-01-11 MED ORDER — PENICILLIN V POTASSIUM 250 MG PO TABS: 250.0000 mg | ORAL_TABLET | Freq: Four times a day (QID) | ORAL | Status: DC

## 2015-01-11 NOTE — ED Provider Notes (Signed)
CSN: [id number]     Arrival date & time 01/11/15  1720 History  By signing my name below, I, Jarvis Morganaylor Ferguson, attest that this documentation has been prepared under the direction and in the presence of Roxy Horsemanobert Caroleen Stoermer, PA-C Electronically Signed: Jarvis Morganaylor Ferguson, ED Scribe. 01/11/2015. 5:42 PM.    Chief Complaint  Patient presents with  . Dental Pain   The history is provided by the patient. No language interpreter was used.    Paul PeppersSteven A Macdonald is a 34 y.o. male with no PMHx who presents to the Emergency Department with a chief complaint of constant, moderate, right lower dental pain onset last night. He states that he broke his tooth last night while eating. He reports the pain is exacerbated with hot/cold liquids and applied pressure. He notes he took Ibuprofen prior to arrival with no significant relief.  Pt does not have dental insurance at this time and states his insurance will not begin until December. Pt is current everyday smoker who smokes 1 ppd. He denies any facial swelling, gum swelling, trouble swallowing, trismus, sore throat, fever or chills.   Past Medical History  Diagnosis Date  . Depression    History reviewed. No pertinent past surgical history. History reviewed. No pertinent family history. Social History  Substance Use Topics  . Smoking status: Current Every Day Smoker -- 1.00 packs/day  . Smokeless tobacco: Never Used  . Alcohol Use: No    Review of Systems  Constitutional: Negative for fever and chills.  HENT: Positive for dental problem. Negative for facial swelling, sore throat and trouble swallowing.       Allergies  Review of patient's allergies indicates no known allergies.  Home Medications   Prior to Admission medications   Medication Sig Start Date End Date Taking? Authorizing Provider  amoxicillin (AMOXIL) 500 MG capsule Take 1 capsule (500 mg total) by mouth 3 (three) times daily. Patient not taking: Reported on 11/20/2014 10/17/14   [facility]  Orlene OchM Neese, NP  LORazepam (ATIVAN) 0.5 MG tablet Take 1 tablet (0.5 mg total) by mouth 2 (two) times daily as needed for anxiety. Patient not taking: Reported on 09/08/2014 07/30/14   Bridgett Larssonhris Post, MD  naproxen (NAPROSYN) 500 MG tablet Take 1 tablet (500 mg total) by mouth 2 (two) times daily with a meal. 11/20/14   Garlon HatchetLisa M Sanders, PA-C   Triage Vitals: BP 143/84 mmHg  Pulse 88  Temp(Src) 98.2 F (36.8 C) (Oral)  Resp 18  SpO2 97%  Physical Exam  Constitutional: He is oriented to person, place, and time. He appears well-developed and well-nourished. No distress.  HENT:  Head: Normocephalic and atraumatic.  Mouth/Throat:    Poor dentition throughout.  Affected tooth as diagrammed.  No signs of peritonsillar or tonsillar abscess.  No signs of gingival abscess. Oropharynx is clear and without exudates.  Uvula is midline.  Airway is intact. No signs of Ludwig's angina with palpation of oral and sublingual mucosa.   Eyes: Conjunctivae and EOM are normal.  Neck: Normal range of motion. Neck supple. No tracheal deviation present.  Cardiovascular: Normal rate.   Pulmonary/Chest: Effort normal. No respiratory distress.  Abdominal: He exhibits no distension.  Musculoskeletal: Normal range of motion.  Neurological: He is alert and oriented to person, place, and time.  Skin: Skin is warm and dry.  Psychiatric: He has a normal mood and affect. His behavior is normal. Judgment and thought content normal.  Nursing note and vitals reviewed.   ED Course  Procedures (including critical  care time)  DIAGNOSTIC STUDIES: Oxygen Saturation is 97% on RA, normal by my interpretation.    COORDINATION OF CARE: 6:05 PM- will apply temporary dental cement. Pt advised of plan for treatment and pt agrees.     MDM   Final diagnoses:  Broken tooth, open, initial encounter    Patient with toothache.  No gross abscess.  Exam unconcerning for Ludwig's angina or spread of infection.  Will treat with penicillin  and OTC pain medicine. Additionally, dental cement placed over cracked tooth.  Urged patient to follow-up with dentist.    I personally performed the services described in this documentation, which was scribed in my presence. The recorded information has been reviewed and is accurate.       Roxy Horseman, PA-C 01/11/15 1830  Rolland Porter, MD 01/23/15 [phone]

## 2015-01-11 NOTE — ED Notes (Signed)
Pt sts right lower dental pain after breaking tooth

## 2015-01-11 NOTE — Discharge Instructions (Signed)

## 2015-01-12 ENCOUNTER — Encounter (HOSPITAL_COMMUNITY): Payer: Self-pay | Admitting: *Deleted

## 2015-01-12 ENCOUNTER — Emergency Department (HOSPITAL_COMMUNITY)
Admission: EM | Admit: 2015-01-12 | Discharge: 2015-01-12 | Disposition: A | Discharge status: Home / Self Care | Payer: Self-pay | Attending: Emergency Medicine | Admitting: Emergency Medicine

## 2015-01-12 ENCOUNTER — Encounter (HOSPITAL_COMMUNITY): Payer: Self-pay | Admitting: Emergency Medicine

## 2015-01-12 DIAGNOSIS — K0889 Other specified disorders of teeth and supporting structures: Secondary | ICD-10-CM | POA: Insufficient documentation

## 2015-01-12 DIAGNOSIS — Z720 Tobacco use: Secondary | ICD-10-CM | POA: Insufficient documentation

## 2015-01-12 DIAGNOSIS — Z792 Long term (current) use of antibiotics: Secondary | ICD-10-CM | POA: Insufficient documentation

## 2015-01-12 DIAGNOSIS — Z791 Long term (current) use of non-steroidal anti-inflammatories (NSAID): Secondary | ICD-10-CM | POA: Insufficient documentation

## 2015-01-12 DIAGNOSIS — R61 Generalized hyperhidrosis: Secondary | ICD-10-CM | POA: Insufficient documentation

## 2015-01-12 DIAGNOSIS — K029 Dental caries, unspecified: Secondary | ICD-10-CM | POA: Insufficient documentation

## 2015-01-12 DIAGNOSIS — Z8781 Personal history of (healed) traumatic fracture: Secondary | ICD-10-CM | POA: Insufficient documentation

## 2015-01-12 DIAGNOSIS — Z8659 Personal history of other mental and behavioral disorders: Secondary | ICD-10-CM | POA: Insufficient documentation

## 2015-01-12 MED ORDER — PENICILLIN V POTASSIUM 500 MG PO TABS: 500.0000 mg | ORAL_TABLET | Freq: Three times a day (TID) | ORAL | Status: DC

## 2015-01-12 MED ORDER — OXYCODONE-ACETAMINOPHEN 5-325 MG PO TABS: 1.0000 | ORAL_TABLET | Freq: Three times a day (TID) | ORAL | Status: DC | PRN

## 2015-01-12 MED ORDER — HYDROCODONE-ACETAMINOPHEN 5-325 MG PO TABS
2.0000 | ORAL_TABLET | Freq: Once | ORAL | Status: AC
  Administered 2015-01-12: 2 via ORAL
  Filled 2015-01-12: qty 2

## 2015-01-12 MED ORDER — HYDROCODONE-ACETAMINOPHEN 5-325 MG PO TABS: 1.0000 | ORAL_TABLET | Freq: Two times a day (BID) | ORAL | Status: DC | PRN

## 2015-01-12 NOTE — ED Provider Notes (Signed)
CSN: [id number]     Arrival date & time 01/12/15  0115 History  By signing my name below, I, Lyndel SafeKaitlyn Shelton, attest that this documentation has been prepared under the direction and in the presence of Tomasita CrumbleAdeleke Natanya Holecek, MD. Electronically Signed: Lyndel SafeKaitlyn Shelton, ED Scribe. 01/12/2015. 1:45 AM.   Chief Complaint  Patient presents with  . Dental Pain   The history is provided by the patient. No language interpreter was used.   HPI Comments: Paul Macdonald is a 34 y.o. male, who is a current daily smoker at 1 ppd, presents to the Emergency Department complaining of constant, severe right lower dental pain s/p dental fracture 1 day ago. The pt was seen in the ED earlier today for the same complaint when dental cement was applied to the fractured tooth and the pt was prescribed a penicillin course. He was advised to take ibuprofen and follow up with the dental referral given. Pt notes his pain has worsened since leaving the ED 8 hours ago and has been unrelieved by ibuprofen and salt water gargles. Denies fevers or chills, facial swelling, trouble swallowing, or trismus.   Past Medical History  Diagnosis Date  . Depression    History reviewed. No pertinent past surgical history. No family history on file. Social History  Substance Use Topics  . Smoking status: Current Every Day Smoker -- 1.00 packs/day  . Smokeless tobacco: Never Used  . Alcohol Use: No    Review of Systems  Constitutional: Negative for fever and chills.  HENT: Positive for dental problem. Negative for facial swelling and trouble swallowing.   A complete 10 system review of systems was obtained and is otherwise negative except at noted in the HPI and PMH.  Allergies  Review of patient's allergies indicates no known allergies.  Home Medications   Prior to Admission medications   Medication Sig Start Date End Date Taking? Authorizing Provider  amoxicillin (AMOXIL) 500 MG capsule Take 1 capsule (500 mg total) by mouth  3 (three) times daily. Patient not taking: Reported on 11/20/2014 10/17/14   [facility]M Neese, NP  LORazepam (ATIVAN) 0.5 MG tablet Take 1 tablet (0.5 mg total) by mouth 2 (two) times daily as needed for anxiety. Patient not taking: Reported on 09/08/2014 07/30/14   Bridgett Larssonhris Post, MD  naproxen (NAPROSYN) 500 MG tablet Take 1 tablet (500 mg total) by mouth 2 (two) times daily with a meal. 11/20/14   Garlon HatchetLisa M Sanders, PA-C  penicillin v potassium (VEETID) 250 MG tablet Take 1 tablet (250 mg total) by mouth 4 (four) times daily. 01/11/15 01/18/15  Roxy Horsemanobert Browning, PA-C   BP 137/86 mmHg  Pulse 80  Temp(Src) 98 F (36.7 C) (Oral)  Resp 16  Ht 6\' 2"  (1.88 m)  Wt 329 lb (149.233 kg)  BMI 42.22 kg/m2  SpO2 97% Physical Exam  Constitutional: He is oriented to person, place, and time. Vital signs are normal. He appears well-developed and well-nourished.  Non-toxic appearance. He does not appear ill. No distress.  HENT:  Head: Normocephalic and atraumatic.  Nose: Nose normal.  Mouth/Throat: Oropharynx is clear and moist. No oropharyngeal exudate.  Eyes: Conjunctivae and EOM are normal. Pupils are equal, round, and reactive to light. No scleral icterus.  Neck: Normal range of motion. Neck supple. No tracheal deviation, no edema, no erythema and normal range of motion present. No thyroid mass and no thyromegaly present.  Cardiovascular: Normal rate, regular rhythm, S1 normal, S2 normal, normal heart sounds, intact distal pulses and normal  pulses.  Exam reveals no gallop and no friction rub.   No murmur heard. Pulses:      Radial pulses are 2+ on the right side, and 2+ on the left side.       Dorsalis pedis pulses are 2+ on the right side, and 2+ on the left side.  Pulmonary/Chest: Effort normal and breath sounds normal. No respiratory distress. He has no wheezes. He has no rhonchi. He has no rales.  Abdominal: Soft. Normal appearance and bowel sounds are normal. He exhibits no distension, no ascites and no mass.  There is no hepatosplenomegaly. There is no tenderness. There is no rebound, no guarding and no CVA tenderness.  Musculoskeletal: Normal range of motion. He exhibits no edema or tenderness.  Lymphadenopathy:    He has no cervical adenopathy.  Neurological: He is alert and oriented to person, place, and time. He has normal strength. No cranial nerve deficit or sensory deficit.  Skin: Skin is warm, dry and intact. No petechiae and no rash noted. He is not diaphoretic. No erythema. No pallor.  Psychiatric: He has a normal mood and affect. His behavior is normal. Judgment normal.  Nursing note and vitals reviewed.   ED Course  Procedures  DIAGNOSTIC STUDIES: Oxygen Saturation is 97% on RA, normal by my interpretation.    COORDINATION OF CARE: 1:40 AM Discussed treatment plan with pt at bedside and pt agreed to plan.   MDM   Final diagnoses:  None    Patient presents to the emergency department for dental pain. He was seen here earlier and states, ibuprofen not working for him. Looking back he does not have a track record of drug-seeking behavior. He was given 2 Norco in emergency department. We'll discharge with Norco as well. Follow-up again encouraged. He appears well-developed acute distress, vital signs were within his normal limits and he is safe for discharge.   I personally performed the services described in this documentation, which was scribed in my presence. The recorded information has been reviewed and is accurate.      Tomasita Crumble, MD 01/12/15 [phone]

## 2015-01-12 NOTE — Discharge Instructions (Signed)
Dental Pain Paul Macdonald, see dentist within 3 days for close follow-up. If any symptoms worsen come back to emergency department immediately. Take ibuprofen as needed for your pain, if it becomes severe take 1 Norco. Thank you. Dental pain may be caused by many things, including:  Tooth decay (cavities or caries). Cavities cause the nerve of your tooth to be open to air and hot or cold temperatures. This can cause pain or discomfort.  Abscess or infection. A dental abscess is an area that is full of infected pus from a bacterial infection in the inner part of the tooth (pulp). It usually happens at the end of the tooth's root.  Injury.  An unknown reason (idiopathic). Your pain may be mild or severe. It may only happen when:  You are chewing.  You are exposed to hot or cold temperature.  You are eating or drinking sugary foods or beverages, such as:  Soda.  Candy. Your pain may also be there all of the time. HOME CARE Watch your dental pain for any changes. Do these things to lessen your discomfort:  Take medicines only as told by your dentist.  If your dentist tells you to take an antibiotic medicine, finish all of it even if you start to feel better.  Keep all follow-up visits as told by your dentist. This is important.  Do not apply heat to the outside of your face.  Rinse your mouth or gargle with salt water if told by your dentist. This helps with pain and swelling.  You can make salt water by adding  tsp of salt to 1 cup of warm water.  Apply ice to the painful area of your face:  Put ice in a plastic bag.  Place a towel between your skin and the bag.  Leave the ice on for 20 minutes, 2-3 times per day.  Avoid foods or drinks that cause you pain, such as:  Very hot or very cold foods or drinks.  Sweet or sugary foods or drinks. GET HELP IF:  Your pain is not helped with medicines.  Your symptoms are worse.  You have new symptoms. GET HELP RIGHT  AWAY IF:  You cannot open your mouth.  You are having trouble breathing or swallowing.  You have a fever.  Your face, neck, or jaw is puffy (swollen).   This information is not intended to replace advice given to you by your health care provider. Make sure you discuss any questions you have with your health care provider.   Document Released: 08/12/2007 Document Revised: 07/10/2014 Document Reviewed: 02/19/2014 Elsevier Interactive Patient Education [employer]2016 Elsevier Inc.

## 2015-01-12 NOTE — ED Provider Notes (Signed)
CSN: [id number]     Arrival date & time 01/12/15  1616 History  By signing my name below, I, Elon SpannerGarrett Cook, attest that this documentation has been prepared under the direction and in the presence of Marlon Peliffany Kehlani Vancamp, PA-C. Electronically Signed: Elon SpannerGarrett Cook ED Scribe. 01/12/2015. 4:55 PM.    Chief Complaint  Patient presents with  . Dental Pain   The history is provided by the patient. No language interpreter was used.    HPI Comments: Paul Macdonald is a 34 y.o. male with 3 ED visits within 24 hours who presents to the Emergency Department complaining of constant, severe right lower dental pain onset 2 days ago s/p a tooth fx.  He has taken ibuprofen, Tylenol, used salt water gargles, and Norco 5-325 (rx'd #10 BID yesterday with #3 pills remaining), none of which afforded relief.  In addition to the Norco at his initial visit to the ED he was rx'd penicillin but lost the prescription.  His tooth was also cemented, which afforded relief for one hour until the cement fell out, causing worsening.  The patient reports he was given a dental referral which he has scheduled for 11/7 and he returns primarily for pain control.   Past Medical History  Diagnosis Date  . Depression    History reviewed. No pertinent past surgical history. No family history on file. Social History  Substance Use Topics  . Smoking status: Current Every Day Smoker -- 1.00 packs/day  . Smokeless tobacco: Never Used  . Alcohol Use: No    Review of Systems A complete 10 system review of systems was obtained and all systems are negative except as noted in the HPI and PMH.   Allergies  Review of patient's allergies indicates no known allergies.  Home Medications   Prior to Admission medications   Medication Sig Start Date End Date Taking? Authorizing Provider  HYDROcodone-acetaminophen (NORCO/VICODIN) 5-325 MG tablet Take 1 tablet by mouth 2 (two) times daily as needed for severe pain. 01/12/15   Tomasita CrumbleAdeleke Oni, MD   ibuprofen (ADVIL,MOTRIN) 200 MG tablet Take 200 mg by mouth every 6 (six) hours as needed for mild pain.    Historical Provider, MD  oxyCODONE-acetaminophen (PERCOCET/ROXICET) 5-325 MG tablet Take 1 tablet by mouth every 8 (eight) hours as needed for severe pain. 01/12/15   Marlon Peliffany Wilfred Dayrit, PA-C  penicillin v potassium (VEETID) 500 MG tablet Take 1 tablet (500 mg total) by mouth 3 (three) times daily. 01/12/15   Amarianna Abplanalp Neva SeatGreene, PA-C   BP 158/100 mmHg  Pulse 99  Temp(Src) 97.7 F (36.5 C) (Oral)  Resp 24  SpO2 94% Physical Exam  Constitutional: He is oriented to person, place, and time. He appears well-developed and well-nourished. He appears distressed.  HENT:  Head: Normocephalic and atraumatic.  Mouth/Throat: No oral lesions. No trismus in the jaw. Dental caries present.    + broken upper right molar  Eyes: Conjunctivae and EOM are normal. Pupils are equal, round, and reactive to light.  Neck: Normal range of motion. Neck supple. No tracheal deviation present.  Cardiovascular: Normal rate and regular rhythm.   Pulmonary/Chest: Effort normal and breath sounds normal. No respiratory distress.  Musculoskeletal: Normal range of motion.  Neurological: He is alert and oriented to person, place, and time.  Skin: Skin is warm. He is diaphoretic.  Psychiatric: He has a normal mood and affect. His behavior is normal.  Nursing note and vitals reviewed.   ED Course  Procedures (including critical care time)  DIAGNOSTIC STUDIES:  Oxygen Saturation is 94% on RA, adequate by my interpretation.    COORDINATION OF CARE:  4:46 PM Advised patient of plan to afford dental block.  Will prescribe pain medication.  Advised patient to return to ED for pain control if needed.   Patient acknowledges and agrees with plan.    Labs Review Labs Reviewed - No data to display  Imaging Review No results found. I have personally reviewed and evaluated these images and lab results as part of my medical  decision-making.   EKG Interpretation None      MDM   Final diagnoses:  Toothache   Pt is diaphoretic and shaking with pain --- all of this improved after a dental block. Pt has a dental appointment scheduled for Monday, reports the Vicodin is not strong enough, rx 8 tabs of Percocet.  DENTAL NERVE BLOCK Date/Time: 01/12/2015 Performed by: Dorthula Matas Authorized by: Dorthula Matas Consent: Verbal consent obtained. Risks and benefits: risks, benefits and alternatives were discussed Consent given by: patient Indications: pain relief Body area: face/mouth Laterality: right upper molar Needle gauge: 25 G Local anesthetic: lidocaine 2% without epinephrine Anesthetic total: 2 ml Outcome: pain improved Patient tolerance: Patient tolerated the procedure well with no immediate complications. Comments: Patient had complete relief of pain. I personally performed the services described in this documentation, which was scribed in my presence. The recorded information has been reviewed and is accurate.  This is the patients 3 rd visit to the ED in 24 hours. He was clearly in severe pain. I told him that I understand the dentist isn't open today but that the ER cannot fix his tooth. He did have cement placed on the first visit but he reports it fell out within two hours and he feels that it made it worse. He reports the Vicodin is not strong enough and won't last till Monday. I wrote him from 8 Percocet. I told him that if his pain becomes severe again he can return for a dental block but that no other medications or things can be done from the ER stand point.  Medications - No data to display  34 y.o.Paul Macdonald's medical screening exam was performed and I feel the patient has had an appropriate workup for their chief complaint at this time and likelihood of emergent condition existing is low. They have been counseled on decision, discharge, follow up and which symptoms  necessitate immediate return to the emergency department. They or their family verbally stated understanding and agreement with plan and discharged in stable condition.   Vital signs are stable at discharge. Filed Vitals:   01/12/15 1629  BP: 158/100  Pulse: 99  Temp: 97.7 F (36.5 C)  Resp: [address] PA-C 01/12/15 1659  Marlon Pel, PA-C 01/12/15 1749  Cathren Laine, MD 01/12/15 [phone]

## 2015-01-12 NOTE — Discharge Instructions (Signed)
Tooth Injuries °Tooth injuries (tooth trauma) include cracked or broken teeth (fractures), teeth that have been moved out of place or dislodged (luxations), and knocked-out teeth (avulsions). °A tooth injury often needs to be treated quickly to save the tooth. However, sometimes it is not possible to save a tooth after an injury, so the tooth may need to be removed (extracted). °CAUSES °Tooth injuries may be caused by any force that is strong enough to chip, break, dislodge, or knock out a tooth. Forces may be due to: °· Sports injuries. °· Falls. °· Accidents. °· Fights. °RISK FACTORS °You may be more likely to injure a tooth if you play a contact sport without using a mouthguard. °SYMPTOMS °A tooth that is forced into the gum may appear dislodged or moved out of position into the tooth socket. A fractured tooth may not be as obvious. Symptoms of a tooth injury include: °· Pain, especially with chewing. °· A loose tooth. °· Bleeding in or around the tooth. °· Swelling or bruising near the tooth. °· Swelling or bruising of the lip over the injured tooth. °· Increased sensitivity to heat and cold. °DIAGNOSIS °A tooth injury can be diagnosed with a medical history and a physical exam. You may also need dental X-rays to check for injuries to the root of the tooth. °TREATMENT °Treatment depends on the type of injury you have and how bad it is. Treatment may need to be done quickly to save your tooth. Possible treatments include: °· Replacing a tooth fragment with a filling, cap, or hard, protective cover (crown). This may be an option for a chip or fracture that does not involve the inside of your tooth (pulp). °· Having a procedure to repair the inside of the tooth (root canal) and then having a crown placed on top. This may be done to treat a tooth fracture that involves the pulp. °· Repositioning a dislodged tooth, then doing a root canal. The root canal usually needs to be done within a few days of the  injury. °· Replacing a knocked-out tooth in the socket, if possible, then doing a root canal a few weeks later. °· Tooth extraction for a fracture that extends below your gumline or splits your tooth completely. °HOME CARE INSTRUCTIONS °· Take medicines only as directed by your dental provider or health care provider. °· Keep all follow-up visits as directed by your dental provider or health care provider. This is important. °· Do not eat or chew on very hard objects. These include ice cubes, pens, pencils, hard candy, and popcorn kernels. °· Do not clench or grind your teeth. Tell your dental provider or health care provider if you grind your teeth while you sleep. °· Apply ice to your mouth near the injured tooth as directed by your dental provider or health care provider. °· Follow instructions about rinsing your mouth with salt water as directed by your dental provider or health care provider. °· Do not use your teeth to open packages. °· Always wear mouth protection when you play contact sports. °SEEK MEDICAL CARE IF: °· You continue to have tooth pain after a tooth injury. °· Your tooth is sensitive to heat and cold. °· You develop swelling near your injured tooth. °· You have a fever. °· You are unable to open your jaw. °· You are drooling and it is getting worse. °  °This information is not intended to replace advice given to you by your health care provider. Make sure you discuss any   questions you have with your health care provider. °  °Document Released: 11/21/2003 Document Revised: 07/10/2014 Document Reviewed: 02/19/2014 °Elsevier Interactive Patient Education ©2016 Elsevier Inc. ° °

## 2015-01-12 NOTE — ED Notes (Signed)
Pt. reports persistent right lower molar pain unrelieved by OTC Tylenol and Ibuprofen .

## 2015-01-12 NOTE — ED Notes (Signed)
The pt is c/o pain from a broken tooth  He has had this [pain for the past 2 days.  He has been seen here x 2 in the past 12 hours  Writhing in pain sweating shaking all over

## 2015-01-13 ENCOUNTER — Encounter (HOSPITAL_COMMUNITY): Payer: Self-pay | Admitting: *Deleted

## 2015-01-13 ENCOUNTER — Emergency Department (HOSPITAL_COMMUNITY)
Admission: EM | Admit: 2015-01-13 | Discharge: 2015-01-13 | Disposition: A | Discharge status: Home / Self Care | Payer: Self-pay | Attending: Emergency Medicine | Admitting: Emergency Medicine

## 2015-01-13 DIAGNOSIS — K002 Abnormalities of size and form of teeth: Secondary | ICD-10-CM | POA: Insufficient documentation

## 2015-01-13 DIAGNOSIS — K029 Dental caries, unspecified: Secondary | ICD-10-CM | POA: Insufficient documentation

## 2015-01-13 DIAGNOSIS — Z8659 Personal history of other mental and behavioral disorders: Secondary | ICD-10-CM | POA: Insufficient documentation

## 2015-01-13 DIAGNOSIS — K0889 Other specified disorders of teeth and supporting structures: Secondary | ICD-10-CM | POA: Insufficient documentation

## 2015-01-13 DIAGNOSIS — Z720 Tobacco use: Secondary | ICD-10-CM | POA: Insufficient documentation

## 2015-01-13 MED ORDER — LIDOCAINE HCL 2 % IJ SOLN
20.0000 mL | Freq: Once | INTRAMUSCULAR | Status: AC
  Administered 2015-01-13: 400 mg via INTRADERMAL
  Filled 2015-01-13: qty 20

## 2015-01-13 MED ORDER — BUPIVACAINE-EPINEPHRINE (PF) 0.5% -1:200000 IJ SOLN
1.8000 mL | Freq: Once | INTRAMUSCULAR | Status: AC
  Administered 2015-01-13: 1.8 mL
  Filled 2015-01-13 (×3): qty 1.8

## 2015-01-13 MED ORDER — BUPIVACAINE-EPINEPHRINE (PF) 0.5% -1:200000 IJ SOLN
1.8000 mL | Freq: Once | INTRAMUSCULAR | Status: AC
  Administered 2015-01-13: 1.8 mL
  Filled 2015-01-13: qty 1.8

## 2015-01-13 NOTE — ED Notes (Signed)
Pt. Here for the 4th time in the past 2 days for the same tooth pain. Pt. States he has been taking motrin and tylenol around the clock for the past couple of days and it hasnt helped.

## 2015-01-13 NOTE — ED Provider Notes (Signed)
CSN: [id number]     Arrival date & time 01/13/15  0600 History   None    Chief Complaint  Patient presents with  . Dental Pain     (Consider location/radiation/quality/duration/timing/severity/associated sxs/prior Treatment) HPI Comments: Patient presents with complaint of toothache. This is his fourth visit in the past 3 days. Patient complains of right upper and lower tooth pain which has been going on for several days. He has taken Vicodin and Percocet without relief. He has received pain relief from a dental block performed on the last 2 visits. He is requesting same today. Patient states that he needs to "make it for 24 more hours" so that he can see a dentist. Patient denies neck swelling, difficulty breathing or swallowing. No other treatments prior to arrival. Onset acute. Course is constant.  Patient is a 33 y.o. male presenting with tooth pain. The history is provided by the patient.  Dental Pain Associated symptoms: no facial swelling, no fever, no headaches and no neck pain     Past Medical History  Diagnosis Date  . Depression    History reviewed. No pertinent past surgical history. History reviewed. No pertinent family history. Social History  Substance Use Topics  . Smoking status: Current Every Day Smoker -- 1.00 packs/day  . Smokeless tobacco: Never Used  . Alcohol Use: No    Review of Systems  Constitutional: Negative for fever.  HENT: Positive for dental problem. Negative for ear pain, facial swelling, sore throat and trouble swallowing.   Respiratory: Negative for shortness of breath and stridor.   Musculoskeletal: Negative for neck pain.  Skin: Negative for color change.  Neurological: Negative for headaches.      Allergies  Review of patient's allergies indicates no known allergies.  Home Medications   Prior to Admission medications   Medication Sig Start Date End Date Taking? Authorizing Provider  HYDROcodone-acetaminophen (NORCO/VICODIN) 5-325  MG tablet Take 1 tablet by mouth 2 (two) times daily as needed for severe pain. 01/12/15   Tomasita Crumble, MD  ibuprofen (ADVIL,MOTRIN) 200 MG tablet Take 200 mg by mouth every 6 (six) hours as needed for mild pain.    Historical Provider, MD  oxyCODONE-acetaminophen (PERCOCET/ROXICET) 5-325 MG tablet Take 1 tablet by mouth every 8 (eight) hours as needed for severe pain. 01/12/15   Marlon Pel, PA-C  penicillin v potassium (VEETID) 500 MG tablet Take 1 tablet (500 mg total) by mouth 3 (three) times daily. 01/12/15   Tiffany Neva Seat, PA-C   BP 117/88 mmHg  Pulse 75  Temp(Src) 98 F (36.7 C) (Oral)  Resp 18  Ht  (1.88 m)  Wt 320 lb (145.151 kg)  BMI 41.07 kg/m2  SpO2 98%   Physical Exam  Constitutional: He appears well-developed and well-nourished.  HENT:  Head: Atraumatic.  Right Ear: Tympanic membrane, external ear and ear canal normal.  Left Ear: Tympanic membrane, external ear and ear canal normal.  Nose: Nose normal.  Mouth/Throat: Uvula is midline, oropharynx is clear and moist and mucous membranes are normal. No trismus in the jaw. Abnormal dentition. Dental caries present. No dental abscesses or uvula swelling. No tonsillar abscesses.  Patient with tenderness at the base of tooth #2 and tooth #30. Mild erythema without gross abscess. Patient has overall poor dentition with several missing teeth.  Eyes: Pupils are equal, round, and reactive to light.  Neck: Normal range of motion. Neck supple.  No neck swelling or Lugwig's angina  Neurological: He is alert.  Skin: Skin is  warm and dry.  Psychiatric: He has a normal mood and affect.  Nursing note and vitals reviewed.   ED Course  Procedures (including critical care time) Labs Review Labs Reviewed - No data to display  Imaging Review No results found. I have personally reviewed and evaluated these images and lab results as part of my medical decision-making.   EKG Interpretation None       6:57 AM Patient seen and  examined. Medications ordered. Patient agrees to proceed with dental block.  Vital signs reviewed and are as follows: BP 117/88 mmHg  Pulse 75  Temp(Src) 98 F (36.7 C) (Oral)  Resp 18  Ht 6\' 2"  (1.88 m)  Wt 320 lb (145.151 kg)  BMI 41.07 kg/m2  SpO2 98%  8:01 AM Waited 1 hr to obtain marcaine from pharmacy.   Dental block was performed. 1.665mL of 2% lidocaine without epi was combined with 1.158mL 0.5% bupivacaine with epi and a block was performed. Injections made at base of tooth #2 as well as the tooth immediately posterior and anterior to tooth. Adequate anesthesia was obtained. Minimal bleeding after injections. Patient tolerated procedure well with no immediate complications.  Dental block was performed. 1.645mL of 2% lidocaine without epi was combined with 1.718mL 0.5% bupivacaine with epi and a block was performed. Injections made at base of tooth #30 as well as the tooth immediately posterior and anterior to tooth. Adequate anesthesia was obtained. Minimal bleeding after injections. Patient tolerated procedure well with no immediate complications.     MDM   Final diagnoses:  Pain, dental   Patient with toothache. No fever. Exam unconcerning for Ludwig's angina or other deep tissue infection in neck. Patient wants more Percocet. I declined.   Pt to continue treatment from previous visits and follow-up tomorrow with dentist.     Renne CriglerJoshua Patsie Mccardle, PA-C 01/13/15 [id number]  Glynn OctaveStephen Rancour, MD 01/13/15 1150

## 2015-01-13 NOTE — ED Notes (Signed)
PA at bedside.

## 2015-01-13 NOTE — Discharge Instructions (Signed)
Please read and follow all provided instructions.  Your diagnoses today include:  1. Pain, dental     The exam and treatment you received today has been provided on an emergency basis only. This is not a substitute for complete medical or dental care.  Tests performed today include:  Vital signs. See below for your results today.   Medications prescribed:   None  Take any prescribed medications only as directed.  Home care instructions:  Follow any educational materials contained in this packet.  Follow-up instructions: Please follow-up with your dentist for further evaluation of your symptoms.   Dental Assistance: See below for dental referrals  Return instructions:   Please return to the Emergency Department if you experience worsening symptoms.  Please return if you develop a fever, you develop more swelling in your face or neck, you have trouble breathing or swallowing food.  Please return if you have any other emergent concerns.  Additional Information:  Your vital signs today were: BP 117/88 mmHg   Pulse 75   Temp(Src) 98 F (36.7 C) (Oral)   Resp 18   Ht 6\' 2"  (1.88 m)   Wt 320 lb (145.151 kg)   BMI 41.07 kg/m2   SpO2 98% If your blood pressure (BP) was elevated above 135/85 this visit, please have this repeated by your doctor within one month. -------------- Dental Care: Organization         Address  Phone  Notes  [facility] [address], [place] [phone] Accepts children up to age 32 who are enrolled in [place] or [place] Health Choice; pregnant women with a Medicaid card; and children who have applied for Medicaid or [place] Health Choice, but were declined, whose parents can pay a reduced fee at time of service.  [facility] [place]  [address], [place] [phone] Accepts children up to age 32 who are enrolled in [place] or [place] Health Choice;  pregnant women with a Medicaid card; and children who have applied for Medicaid or [place] Health Choice, but were declined, whose parents can pay a reduced fee at time of service.  [facility]  [address], [place] [phone]) [phone] Patients are seen by appointment only. Walk-ins are not accepted. [facility] will see patients 818 years of age and older. Monday - Tuesday (8am-5pm) Most Wednesdays (8:30-5pm) $30 per visit, cash only  [facility]  [address], [facility] [phone] Patients are seen by appointment only. Walk-ins are not accepted. [facility] will see patients 32 years of age and older. One Wednesday Evening (Monthly: Volunteer Based).  $30 per visit, cash only  [facility]  [phone] for adults; Children under age 324, call Graduate Pediatric Dentistry at [phone]) [phone]. Children aged 504-14, please call [phone] to request a pediatric application.  Dental services are provided in all areas of dental care including fillings, crowns and bridges, complete and partial dentures, implants, gum treatment, root canals, and extractions. Preventive care is also provided. Treatment is provided to both adults and children. Patients are selected via a lottery and there is often a waiting list.   [facility] [address], [place]  [phone] [email or web address]   [facility] [address], [place], [place] [phone], Ext. [phone] Second and Fourth Thursday of each month, opens at 6:30 AM;  Clinic ends at 9 AM.  Patients are seen on a first-come first-served basis, and a limited number are seen during each clinic.   [facility]  [address] [place], [place] [phone]   Eligibility Requirements You must have lived in [place], [place], or [place] counties for at least the last three months.   You cannot be eligible for state or federal  sponsored [place], including [employer], [place], or [employer].   You generally cannot be eligible for healthcare insurance through your employer.    How to apply: Eligibility screenings are held every Tuesday and Wednesday afternoon from 1:00 pm until 4:00 pm. You do not need an appointment for the interview!  [facility] [address], [place], [place] [phone]   [facility] Health Department  [phone]   [facility] Department  [phone]   [facility]  [phone]

## 2015-01-24 ENCOUNTER — Encounter (HOSPITAL_COMMUNITY): Payer: Self-pay | Admitting: Emergency Medicine

## 2015-01-24 ENCOUNTER — Emergency Department (HOSPITAL_COMMUNITY): Payer: Self-pay

## 2015-01-24 ENCOUNTER — Emergency Department (HOSPITAL_COMMUNITY)
Admission: EM | Admit: 2015-01-24 | Discharge: 2015-01-24 | Disposition: A | Discharge status: Home / Self Care | Payer: Self-pay | Attending: Emergency Medicine | Admitting: Emergency Medicine

## 2015-01-24 DIAGNOSIS — F17200 Nicotine dependence, unspecified, uncomplicated: Secondary | ICD-10-CM | POA: Insufficient documentation

## 2015-01-24 DIAGNOSIS — F329 Major depressive disorder, single episode, unspecified: Secondary | ICD-10-CM | POA: Insufficient documentation

## 2015-01-24 DIAGNOSIS — R55 Syncope and collapse: Secondary | ICD-10-CM | POA: Insufficient documentation

## 2015-01-24 DIAGNOSIS — I951 Orthostatic hypotension: Secondary | ICD-10-CM

## 2015-01-24 DIAGNOSIS — F32A Depression, unspecified: Secondary | ICD-10-CM

## 2015-01-24 DIAGNOSIS — Z79899 Other long term (current) drug therapy: Secondary | ICD-10-CM | POA: Insufficient documentation

## 2015-01-24 LAB — CBC WITH DIFFERENTIAL/PLATELET
BASOS ABS: 0 10*3/uL (ref 0.0–0.1)
Basophils Relative: 0 %
Eosinophils Absolute: 0.1 10*3/uL (ref 0.0–0.7)
Eosinophils Relative: 1 %
HCT: 47.6 % (ref 39.0–52.0)
Hemoglobin: 16.4 g/dL (ref 13.0–17.0)
LYMPHS PCT: 20 %
Lymphs Abs: 2 10*3/uL (ref 0.7–4.0)
MCH: 32 pg (ref 26.0–34.0)
MCHC: 34.5 g/dL (ref 30.0–36.0)
MCV: 92.8 fL (ref 78.0–100.0)
MONO ABS: 0.7 10*3/uL (ref 0.1–1.0)
Monocytes Relative: 7 %
Neutro Abs: 7.1 10*3/uL (ref 1.7–7.7)
Neutrophils Relative %: 72 %
PLATELETS: 285 10*3/uL (ref 150–400)
RBC: 5.13 MIL/uL (ref 4.22–5.81)
RDW: 12.5 % (ref 11.5–15.5)
WBC: 10 10*3/uL (ref 4.0–10.5)

## 2015-01-24 LAB — COMPREHENSIVE METABOLIC PANEL
ALT: 59 U/L (ref 17–63)
ANION GAP: 7 (ref 5–15)
AST: 34 U/L (ref 15–41)
Albumin: 4.4 g/dL (ref 3.5–5.0)
Alkaline Phosphatase: 55 U/L (ref 38–126)
BUN: 10 mg/dL (ref 6–20)
CHLORIDE: 105 mmol/L (ref 101–111)
CO2: 27 mmol/L (ref 22–32)
Calcium: 9.2 mg/dL (ref 8.9–10.3)
Creatinine, Ser: 1.18 mg/dL (ref 0.61–1.24)
GFR calc non Af Amer: >60 mL/min (ref >60)
Glucose, Bld: 111 mg/dL — ABNORMAL HIGH (ref 65–99)
POTASSIUM: 4.2 mmol/L (ref 3.5–5.1)
SODIUM: 139 mmol/L (ref 135–145)
Total Bilirubin: 0.2 mg/dL — ABNORMAL LOW (ref 0.3–1.2)
Total Protein: 7.5 g/dL (ref 6.5–8.1)

## 2015-01-24 LAB — I-STAT CHEM 8, ED
BUN: 10 mg/dL (ref 6–20)
CALCIUM ION: 1.18 mmol/L (ref 1.12–1.23)
Chloride: 102 mmol/L (ref 101–111)
Creatinine, Ser: 1.2 mg/dL (ref 0.61–1.24)
GLUCOSE: 109 mg/dL — AB (ref 65–99)
HCT: 52 % (ref 39.0–52.0)
HEMOGLOBIN: 17.7 g/dL — AB (ref 13.0–17.0)
Potassium: 4.1 mmol/L (ref 3.5–5.1)
Sodium: 142 mmol/L (ref 135–145)
TCO2: 25 mmol/L (ref 0–100)

## 2015-01-24 LAB — CBG MONITORING, ED: Glucose-Capillary: 100 mg/dL — ABNORMAL HIGH (ref 65–99)

## 2015-01-24 MED ORDER — MECLIZINE HCL 25 MG PO TABS: 25.0000 mg | ORAL_TABLET | Freq: Three times a day (TID) | ORAL | Status: DC | PRN

## 2015-01-24 MED ORDER — SODIUM CHLORIDE 0.9 % IV BOLUS (SEPSIS)
1000.0000 mL | Freq: Once | INTRAVENOUS | Status: AC
  Administered 2015-01-24: 1000 mL via INTRAVENOUS

## 2015-01-24 MED ORDER — MECLIZINE HCL 25 MG PO TABS
25.0000 mg | ORAL_TABLET | Freq: Once | ORAL | Status: AC
  Administered 2015-01-24: 25 mg via ORAL
  Filled 2015-01-24: qty 1

## 2015-01-24 MED ORDER — ACETAMINOPHEN 325 MG PO TABS
650.0000 mg | ORAL_TABLET | Freq: Once | ORAL | Status: AC
  Administered 2015-01-24: 650 mg via ORAL
  Filled 2015-01-24: qty 2

## 2015-01-24 NOTE — ED Notes (Signed)
Nurse drawing labs. 

## 2015-01-24 NOTE — ED Notes (Signed)
Per EMS pt was at work in [employer] caf[employer]ia; pt started to feel dizzy and had blurry vision and coworkers report pt loss consciousness and fell to rubber mat over concrete floor and hit the back of his head; pt presents to ED with c-collar placed en route and co neck pain and headache 6/10; pt denies cardiac history nor diabetes; pt report she woke with headache this morning

## 2015-01-24 NOTE — ED Notes (Signed)
Pt visualized walking to bathroom with steady gait

## 2015-01-24 NOTE — ED Provider Notes (Signed)
CSN: [id number]     Arrival date & time 01/24/15  1034 History   First MD Initiated Contact with Patient 01/24/15 1039     Chief Complaint  Patient presents with  . Loss of Consciousness     (Consider location/radiation/quality/duration/timing/severity/associated sxs/prior Treatment) HPI   34 year old male with history of depression brought here via EMS from [employer] caf[employer]ia for evaluation of a syncopal episode. Patient report he has been going through a lot of stress within the past several months. He had an abscessed tooth requiring extraction several weeks ago. He is a recovering heroin addict and while taking the pain medication he greatly concerns him. He did take it as prescribed. He reports that his knees recently committed suicide. He also reported that he broke up with his girlfriend recently. Therefore, these stresses is causing him to eat less, lacking sleep, and for the past week he has not been feeling himself. Today while at work he felt lightheadedness and dizzy with blurred vision throughout most of the morning shift. He was able to tell his coworker that he needs to go to the ER before passing out. Coworker report patient had a brief episode of loss of consciousness and fell to the floor striking his head against a rubber mat. When he came to he did not had any postictal state but does complain of pain to his neck and the back of his head. Rate pain as sharp and achy, 6 out of 10. No history of cardiac disease or diabetes. No history of seizure. He denies any recent alcohol use or drug use. He denies SI/HI/hallucination. He admits that he is depressed.  Past Medical History  Diagnosis Date  . Depression    History reviewed. No pertinent past surgical history. No family history on file. Social History  Substance Use Topics  . Smoking status: Current Every Day Smoker -- 1.00 packs/day  . Smokeless tobacco: Never Used  . Alcohol Use: No    Review of Systems  All other systems  reviewed and are negative.     Allergies  Review of patient's allergies indicates no known allergies.  Home Medications   Prior to Admission medications   Medication Sig Start Date End Date Taking? Authorizing Provider  HYDROcodone-acetaminophen (NORCO/VICODIN) 5-325 MG tablet Take 1 tablet by mouth 2 (two) times daily as needed for severe pain. 01/12/15   Tomasita CrumbleAdeleke Oni, MD  ibuprofen (ADVIL,MOTRIN) 200 MG tablet Take 200 mg by mouth every 6 (six) hours as needed for mild pain.    Historical Provider, MD  oxyCODONE-acetaminophen (PERCOCET/ROXICET) 5-325 MG tablet Take 1 tablet by mouth every 8 (eight) hours as needed for severe pain. 01/12/15   Marlon Peliffany Greene, PA-C  penicillin v potassium (VEETID) 500 MG tablet Take 1 tablet (500 mg total) by mouth 3 (three) times daily. 01/12/15   Tiffany Neva SeatGreene, PA-C   BP 131/89 mmHg  Pulse 74  Temp(Src) 98.2 F (36.8 C) (Oral)  Resp 20  SpO2 98% Physical Exam  Constitutional: He appears well-developed and well-nourished. No distress.  Awake, alert, nontoxic appearance. Cervical collar in place.  HENT:  Head: Atraumatic.  No hemotympanum, no septal hematoma, no malocclusion, no midface tenderness, no battle sign.  Eyes: Conjunctivae are normal.  Neck: Neck supple.  Cervical collar in place.  Cardiovascular: Normal rate and regular rhythm.   Pulmonary/Chest: Effort normal. No respiratory distress. He exhibits no tenderness.  Abdominal: Soft. There is no tenderness. There is no rebound.  Musculoskeletal: He exhibits tenderness (Tenderness noted to posterior  scalp and cervical midline spine on palpation without crepitus or step-off. No bruising or ecchymosis noted.).  ROM appears intact, no obvious focal weakness  Neurological: He is alert.   A&O x 3, sppech clear, cognition appears to be normal, CN II-XII grossly normal - face symmetric, no deviation tongue, PERRLA EOMI, normal shoulder shrug; MS 5/5 throughout; DTRs 2+ and symmetrical; cerebellar -  normal finger-nose, heel-shin, rapid alternating finger movement, no dysdiadochokinesia, nl gait; cerebellar - no tremor   Skin: Skin is warm and dry. No rash noted.  Psychiatric: He has a normal mood and affect.  Nursing note and vitals reviewed.   ED Course  Procedures (including critical care time)  Patient here for sequela episode. He has been depressed, has not been eating and sleeping which can contribute to his presenting complaint. No history of seizure. No post ictal state. Does have reproducible tenderness to his posterior scalp and C-spine therefore CT scan and x-ray ordered. Workup initiated. Patient appears depressed, and is tearful. He has been having increasing stress. No SI or HI or hallucination. Offer outpatient counseling and patient is receptive.  11:46 AM Positive orthostasis.  IVF given.  No focal neuro deficit except for gait.  Head CT unremarkable.  Cspine xray negative.  Pt receiving his 2nd L fluid.    3:31 PM Recommend pt to f/u with a counselor for his current depression.  Meclizine prescribed to use as needed for dizziness.  Pt otherwise stable for discharge.  He's ambulating without difficulty and vital signs are stable.    Labs Review Labs Reviewed  COMPREHENSIVE METABOLIC PANEL - Abnormal; Notable for the following:    Glucose, Bld 111 (*)    Total Bilirubin 0.2 (*)    All other components within normal limits  CBG MONITORING, ED - Abnormal; Notable for the following:    Glucose-Capillary 100 (*)    All other components within normal limits  I-STAT CHEM 8, ED - Abnormal; Notable for the following:    Glucose, Bld 109 (*)    Hemoglobin 17.7 (*)    All other components within normal limits  CBC WITH DIFFERENTIAL/PLATELET    Imaging Review Dg Cervical Spine Complete  01/24/2015  CLINICAL DATA:  Syncopal episode at work today. Fell bowel grade and hit posterior back of head on rubber mat. Posterior neck pain. Decreased range of motion. Initial  encounter. EXAM: CERVICAL SPINE - COMPLETE 4+ VIEW COMPARISON:  None. FINDINGS: The prevertebral soft tissues are within normal limits. There straightening and some reversal of the normal cervical lordosis. Cervical spine is visualized from skull base through C6-7 on the lateral view. C7-T1 is seen on the swimmer's view and is in normal alignment. Mild uncovertebral spurring is evident at C4-5 and C5-6, right greater than left. Mild osseous foraminal narrowing is present on the right at these 2 levels. The lung apices are clear. IMPRESSION: 1. No acute fracture or traumatic subluxation. 2. Mild degenerative changes at C4-5 and C5-6 as detailed above. Electronically Signed   By: Marin Roberts M.D.   On: 01/24/2015 12:19   Ct Head Wo Contrast  01/24/2015  CLINICAL DATA:  Syncope and collapse EXAM: CT HEAD WITHOUT CONTRAST TECHNIQUE: Contiguous axial images were obtained from the base of the skull through the vertex without intravenous contrast. COMPARISON:  None. FINDINGS: No skull fracture is noted. There is mucosal thickening with almost complete opacification right maxillary sinus. Mild mucosal thickening ethmoid air cells. The mastoid air cells are unremarkable. No intracranial hemorrhage, mass effect  or midline shift. No acute cortical infarction. No mass lesion is noted on this unenhanced scan. The gray and white-matter differentiation is preserved. No acute cortical infarction. No mass lesion is noted on this unenhanced scan. No hydrocephalus. IMPRESSION: No acute intracranial abnormality. Paranasal sinuses disease as described above. Electronically Signed   By: Natasha Mead M.D.   On: 01/24/2015 12:45   I have personally reviewed and evaluated these images and lab results as part of my medical decision-making.   EKG Interpretation None      Date: 01/24/2015  Rate: 78  Rhythm: normal sinus rhythm  QRS Axis: normal  Intervals: normal  ST/T Wave abnormalities: normal  Conduction  Disutrbances: none  Narrative Interpretation:   Old EKG Reviewed: No significant changes noted EKG reviewed by me.    MDM   Final diagnoses:  Orthostasis  Depression  Syncope and collapse    BP 130/62 mmHg  Pulse 78  Temp(Src) 98.2 F (36.8 C) (Oral)  Resp 16  SpO2 98%     Fayrene Helper, PA-C 01/24/15 1532  Alvira Monday, MD 01/27/15 1926

## 2015-01-24 NOTE — ED Notes (Signed)
Pt visualized walking to restroom w/o assistance or difficulty.  Steady gait noted.

## 2015-01-24 NOTE — ED Notes (Signed)
Pt C-collar removed per MD order; pt ambulated in hallway per MD order; pt reports severe dizziness and nausea while ambulating

## 2015-01-24 NOTE — Discharge Instructions (Signed)
Please use resources below to find a primary care provider or to talk to a counselor regarding your depression and stress.  Take meclizine as needed for dizziness and nausea.  Stay hydrated, eat, drink and sleep as appropriate.  Return to ER if your condition worsen or if you have other concerns.     Emergency Department Resource Guide 1) Find a Doctor and Pay Out of Pocket Although you won't have to find out who is covered by your insurance plan, it is a good idea to ask around and get recommendations. You will then need to call the office and see if the doctor you have chosen will accept you as a new patient and what types of options they offer for patients who are self-pay. Some doctors offer discounts or will set up payment plans for their patients who do not have insurance, but you will need to ask so you aren't surprised when you get to your appointment.  2) Contact Your Local Health Department Not all health departments have doctors that can see patients for sick visits, but many do, so it is worth a call to see if yours does. If you don't know where your local health department is, you can check in your phone book. The CDC also has a tool to help you locate your state's health department, and many state websites also have listings of all of their local health departments.  3) Find a Walk-in Clinic If your illness is not likely to be very severe or complicated, you may want to try a walk in clinic. These are popping up all over the country in pharmacies, drugstores, and shopping centers. They're usually staffed by nurse practitioners or physician assistants that have been trained to treat common illnesses and complaints. They're usually fairly quick and inexpensive. However, if you have serious medical issues or chronic medical problems, these are probably not your best option.  No Primary Care Doctor: - Call Health Connect at  [phone] - they can help you locate a primary care doctor that   accepts your insurance, provides certain services, etc. - Physician Referral Service- [phone]  Chronic Pain Problems: Organization         Address  Phone   Notes  [facility]  [phone] Patients need to be referred by their primary care doctor.   Medication Assistance: Organization         Address  Phone   Notes  [facility] [address] [place], [place] [address] [phone] --Must be a resident of [place] [facility] -- Must have NO insurance coverage whatsoever (no Medicaid/ Medicare, etc.) -- The pt. MUST have a primary care doctor that directs their care regularly and follows them in the community   [facility]ist  [phone]   [employer]  [phone]    Agencies that provide inexpensive medical care: Organization         Address  Phone   Notes  [facility]  [phone]   [facility]    [phone]) [phone]   [facility] [address] [place], [place] [address] [phone]   [facility] [address], [place] [phone]) [phone]   [facility]    [phone]   [facility]    [phone]) [phone]   [facility]  [address], [place] Phone:  [phone], Fax:  [phone]) [phone]  Hours of Operation:  9 am - 6 pm, M-F.  Also accepts Medicaid/Medicare and self-pay.  [facility] for [place], [address], [place] Phone: [phone], Fax: [phone]. Hours of Operation:  8:30 am - 5:30 pm, M-F.  Also accepts Medicaid and self-pay.  [facility] [address], [place] Phone: [phone]   [place], [place], [place] [phone], Ext. [phone] Mondays & Thursdays: 7-9 AM.  First 15 patients are seen on a first come, first serve basis.    [place] Providers:  Organization          Address  Phone   Notes  [facility] [address], [place] [phone] Also accepts self-pay patients.  [facility] [address], [place]  [phone]   [place], [address], [place] [phone]   [facility] [address] [place] [phone]   Lucianne Lei [address], [place]   [phone] Only accepts [place] Access [place] patients after they have their name applied to their card.   Self-Pay (no insurance) in [facility]:  Organization         Address  Phone   Notes  Sickle Cell Patients, [facility] [place] [phone]   [facility] [place] [phone]   [facility] [place]  [place], [place], [place] [phone]   [facility]/Dr. Osei-Bonsu  [address] [place] or [address], [place] [phone] Phone number for both [place] and [place] locations is the same.  [facility] [address] [place] [phone]   [facility] [address], [place] or [address] [phone]   [facility] [address], [place] [phone], phone; [phone], fax Sees patients 1st and 3rd Saturday of every month.  Must not qualify for public or private insurance (i.e. Medicaid, Medicare, [place] Health Choice, Veterans' Benefits)  Household income should be no more than 200% of the poverty level The clinic cannot treat you if you are pregnant or think you are pregnant  Sexually transmitted diseases are not treated at the clinic.    Dental Care: Organization         Address  Phone  Notes  [facility] of [facility] [phone] Accepts children up to age 63 who are enrolled in [place] or [place]; pregnant women with a Medicaid card; and children who have applied for Medicaid or [place] Health Choice, but were declined, whose parents can pay a reduced fee at time of service.  [facility] Department of [facility]  [address], [place] [phone] Accepts children up to age 67 who are enrolled in [place] or [place]; pregnant women with a Medicaid card; and children who have applied for Medicaid or [place] Health Choice, but were declined, whose parents can pay a reduced fee at time of service.  [facility]  [place] [phone] Patients are seen by appointment only. Walk-ins are not accepted. [place] will  see patients 78 years of age and older. Monday - Tuesday (8am-5pm) Most Wednesdays (8:30-5pm) $30 per visit, cash only  [facility] Adult Dental Access PROGRAM  [address], [facility] [phone] Patients are seen by appointment only. Walk-ins are not accepted. [place] will see patients 19 years of age and older. One Wednesday Evening (Monthly: Volunteer Based).  $30 per visit, cash only  [place]  [phone] for adults; Children under age 77, call Graduate Pediatric Dentistry at [phone]. Children aged 59-14, please call [phone] to request a pediatric application.  Dental services are provided in all areas of dental care including fillings, crowns and bridges, complete and partial dentures, implants, gum treatment, root canals, and extractions. Preventive care is also provided. Treatment is provided to both adults and children. Patients are selected via a lottery and there is often a waiting list.   [facility] [address] [place]  [phone] [email or web address]   [facility] [address] [place], [place]  [phone], Ext. [phone] Second and Fourth Thursday of each month, opens at 6:30 AM; Clinic ends at 9 AM.  Patients are seen on a first-come first-served basis, and a limited number are seen during each clinic.   [facility]  [address] Hillard Danker [place], [place] [phone]   Eligibility Requirements You must have lived in [place], [place], or [place] counties for at least the last three months.   You cannot be eligible for state or federal sponsored [employer], including [employer], [place], or [employer].   You generally cannot be eligible for healthcare insurance through your employer.    How to apply: Eligibility screenings are held every Tuesday and Wednesday afternoon from 1:00 pm until 4:00 pm. You do not need an appointment for the interview!  [facility] [address] [place], [place]   [place]  [place] Department  [place]  [phone]    Behavioral Health Resources in the Community: Intensive Outpatient Programs Organization         Address  Phone  Notes  [facility] [place]. [address] [place], [place] [phone]   [facility] [address], [place], [place]   [facility]: [facility] [address], [place], [place]   [place] [address]  [place], [place] or [phone]   Substance Abuse Resources Organization         Address  Phone  Notes  Alcohol [facility] Drug Services  [phone]   [place]  [phone]   [facility] [place]   Chinita Pester  [phone]   Residential & Outpatient Substance Abuse Program  [phone]   Psychological Services Organization         Address  Phone  Notes  [facility]  [place]  [phone]    [place] [address], [place] or [phone]    Mobile Crisis Teams Organization         Address  Phone  Notes  [facility], Mobile Crisis Care Unit  [phone]   [facility]  [address] [place], [place]   [facility] [address] [place] [phone]    Self-Help/Support Groups Organization  Address  Phone             Notes  [place]. of [place] - variety of support groups  [place] Call for more information  Narcotics Anonymous (NA), [facility] [address], [employer] [place]  2 meetings at this location   Special educational needs teacher         Address  Phone  Notes  [facility] [place],    [place]  [phone]   [facility]  [address] [place] [address]250, [place], [place]   [place], [place] [phone] Admissions: 8am-3pm M-F  Incentives Substance [place] [address],    [place], [place] [phone]   [facility] [address] [place], [place], [place]   [facility] [address]  [place], [place]   [facility] - Intensive Outpatient [address], [place], [place]   [facility] ([place].,  [place], [place] [phone] or [phone]   [facility] (RTS) [address] [place], [place] Accepts Medicaid  [facility] [place] [address]  [place] [phone] Substance Abuse/Addiction Treatment   [facility] Organization         Address  Phone  Notes  [facility]  [phone]   Domenic Schwab, PhD [address] Arlis Porta [place], [place]   [phone] or [phone]   [place]  [place], [place] [phone]   [facility] [address] [place], [place] [phone] Insurance/Medicaid/sponsorship through [facility] Families [address] [place]                                    [place], [place] [phone] [place] [address], [place] [phone]    Dr. Adele Schilder  [phone]   [facility] Dept. 1) [address], [place] 2) [place] 3)  [place] [address], [place] [phone]  [phone]   [place] [phone] or [phone] (After Hours)

## 2015-01-24 NOTE — ED Notes (Signed)
Patient transported to X-ray 

## 2015-01-25 ENCOUNTER — Encounter (HOSPITAL_COMMUNITY): Payer: Self-pay

## 2015-01-25 ENCOUNTER — Emergency Department (HOSPITAL_COMMUNITY)
Admission: EM | Admit: 2015-01-25 | Discharge: 2015-01-25 | Disposition: A | Discharge status: Home / Self Care | Payer: Self-pay | Attending: Emergency Medicine | Admitting: Emergency Medicine

## 2015-01-25 DIAGNOSIS — Z8659 Personal history of other mental and behavioral disorders: Secondary | ICD-10-CM | POA: Insufficient documentation

## 2015-01-25 DIAGNOSIS — F439 Reaction to severe stress, unspecified: Secondary | ICD-10-CM | POA: Insufficient documentation

## 2015-01-25 DIAGNOSIS — F17200 Nicotine dependence, unspecified, uncomplicated: Secondary | ICD-10-CM | POA: Insufficient documentation

## 2015-01-25 DIAGNOSIS — R55 Syncope and collapse: Secondary | ICD-10-CM | POA: Insufficient documentation

## 2015-01-25 LAB — CBC
HCT: 46 % (ref 39.0–52.0)
Hemoglobin: 15.8 g/dL (ref 13.0–17.0)
MCH: 31.9 pg (ref 26.0–34.0)
MCHC: 34.3 g/dL (ref 30.0–36.0)
MCV: 92.7 fL (ref 78.0–100.0)
PLATELETS: 286 10*3/uL (ref 150–400)
RBC: 4.96 MIL/uL (ref 4.22–5.81)
RDW: 12.6 % (ref 11.5–15.5)
WBC: 11.2 10*3/uL — AB (ref 4.0–10.5)

## 2015-01-25 LAB — BASIC METABOLIC PANEL
ANION GAP: 8 (ref 5–15)
BUN: 9 mg/dL (ref 6–20)
CALCIUM: 9.1 mg/dL (ref 8.9–10.3)
CO2: 28 mmol/L (ref 22–32)
CREATININE: 1.1 mg/dL (ref 0.61–1.24)
Chloride: 104 mmol/L (ref 101–111)
Glucose, Bld: 116 mg/dL — ABNORMAL HIGH (ref 65–99)
Potassium: 4 mmol/L (ref 3.5–5.1)
SODIUM: 140 mmol/L (ref 135–145)

## 2015-01-25 LAB — CBG MONITORING, ED: GLUCOSE-CAPILLARY: 106 mg/dL — AB (ref 65–99)

## 2015-01-25 NOTE — Discharge Instructions (Signed)
Stress and Stress Management °Stress is a normal reaction to life events. It is what you feel when life demands more than you are used to or more than you can handle. Some stress can be useful. For example, the stress reaction can help you catch the last bus of the day, study for a test, or meet a deadline at work. But stress that occurs too often or for too long can cause problems. It can affect your emotional health and interfere with relationships and normal daily activities. Too much stress can weaken your immune system and increase your risk for physical illness. If you already have a medical problem, stress can make it worse. °CAUSES  °All sorts of life events may cause stress. An event that causes stress for one person may not be stressful for another person. Major life events commonly cause stress. These may be positive or negative. Examples include losing your job, moving into a new home, getting married, having a baby, or losing a loved one. Less obvious life events may also cause stress, especially if they occur day after day or in combination. Examples include working long hours, driving in traffic, caring for children, being in debt, or being in a difficult relationship. °SIGNS AND SYMPTOMS °Stress may cause emotional symptoms including, the following: °· Anxiety. This is feeling worried, afraid, on edge, overwhelmed, or out of control. °· Anger. This is feeling irritated or impatient. °· Depression. This is feeling sad, down, helpless, or guilty. °· Difficulty focusing, remembering, or making decisions. °Stress may cause physical symptoms, including the following:  °· Aches and pains. These may affect your head, neck, back, stomach, or other areas of your body. °· Tight muscles or clenched jaw. °· Low energy or trouble sleeping.  °Stress may cause unhealthy behaviors, including the following:  °· Eating to feel better (overeating) or skipping meals. °· Sleeping too little, too much, or both. °· Working  too much or putting off tasks (procrastination). °· Smoking, drinking alcohol, or using drugs to feel better. °DIAGNOSIS  °Stress is diagnosed through an assessment by your health care provider. Your health care provider will ask questions about your symptoms and any stressful life events. Your health care provider will also ask about your medical history and may order blood tests or other tests. Certain medical conditions and medicine can cause physical symptoms similar to stress.  Mental illness can cause emotional symptoms and unhealthy behaviors similar to stress. Your health care provider may refer you to a mental health professional for further evaluation.  °TREATMENT  °Stress management is the recommended treatment for stress. The goals of stress management are reducing stressful life events and coping with stress in healthy ways.  °Techniques for reducing stressful life events include the following: °· Stress identification. Self-monitor for stress and identify what causes stress for you. These skills may help you to avoid some stressful events. °· Time management. Set your priorities, keep a calendar of events, and learn to say "no." These tools can help you avoid making too many commitments. °Techniques for coping with stress include the following: °· Rethinking the problem. Try to think realistically about stressful events rather than ignoring them or overreacting. Try to find the positives in a stressful situation rather than focusing on the negatives. °· Exercise. Physical exercise can release both physical and emotional tension. The key is to find a form of exercise you enjoy and do it regularly. °· Relaxation techniques. These relax the body and mind. Examples include yoga, meditation, tai chi, biofeedback, deep   breathing, progressive muscle relaxation, listening to music, being out in nature, journaling, and other hobbies. Again, the key is to find one or more that you enjoy and can do  regularly.  Healthy lifestyle. Eat a balanced diet, get plenty of sleep, and do not smoke. Avoid using alcohol or drugs to relax.  Strong support network. Spend time with family, friends, or other people you enjoy being around.Express your feelings and talk things over with someone you trust. Counseling or talktherapy with a mental health professional may be helpful if you are having difficulty managing stress on your own. Medicine is typically not recommended for the treatment of stress.Talk to your health care provider if you think you need medicine for symptoms of stress. HOME CARE INSTRUCTIONS  Keep all follow-up visits as directed by your health care provider.  Take all medicines as directed by your health care provider. SEEK MEDICAL CARE IF:  Your symptoms get worse or you start having new symptoms.  You feel overwhelmed by your problems and can no longer manage them on your own. SEEK IMMEDIATE MEDICAL CARE IF:  You feel like hurting yourself or someone else.   This information is not intended to replace advice given to you by your health care provider. Make sure you discuss any questions you have with your health care provider.   Document Released: 08/19/2000 Document Revised: 03/16/2014 Document Reviewed: 10/18/2012 Elsevier Interactive Patient Education [employer].

## 2015-01-25 NOTE — ED Notes (Signed)
Dr. Effie ShyWentz made aware that pt passed out and fell upon arrival to this ED

## 2015-01-25 NOTE — ED Notes (Signed)
Pt pressed call bell and stated he would like to go home. Pt encouraged to stay to at least be assessed by the doctor. Pt agreed and states he would wait. Pt being monitored by 12 lead, continuous pulse ox and BP cuff.

## 2015-01-25 NOTE — ED Notes (Signed)
PER EMS: pt brought in due to seizure like activity. Pt was outside talking with friends and then pt fell to the ground and began to have full body convulsions lasting about 30 seconds. No hx of seizures. A&Ox4 upon EMS arrival and answered questions on arrival and was ambulatory with steady gait.  Additionally, pt reports a similar episode happened yesterday. Pt states he started to feel dizzy and 'I just fell out." [facility] and [facility]c home and told to follow up with PCP if this happened again.  Upon arrival to room E41, similar episode happened here as pt got off EMS stretcher. Pt was walking to the ED stretcher in the room and suddenly fell out and onto the floor, lost consciousness for about 30 seconds (no convulsions witnessed) and only responded to hard sternal rub by EMS. Pt is now A&Ox4, speaking clear complete sentences.  BP: 140/76, HR-110, CBG-120

## 2015-01-25 NOTE — ED Provider Notes (Signed)
CSN: [id number]     Arrival date & time 01/25/15  2025 History   First MD Initiated Contact with Patient 01/25/15 2043     Chief Complaint  Patient presents with  . Seizures     (Consider location/radiation/quality/duration/timing/severity/associated sxs/prior Treatment) HPI   Paul Macdonald is a 34 y.o. male reportedly transferred here for evaluation of seizure. On arrival to the emergency department, he had a 30 second loss of consciousness, apparently fell to the floor, and was assisted up after that. There was no postictal state. There was no injury. He was in the emergency department last night, with syncope, at that time noted to be depressed about several stressful incidents recently including breaking up with his girlfriend, his father being ill, his niece attempted suicide, and a problem with a prescription for narcotics, which troubled him, because he is a former heroin addict. He works at [employer]. There are no other no modifying factors.   Past Medical History  Diagnosis Date  . Depression    History reviewed. No pertinent past surgical history. No family history on file. Social History  Substance Use Topics  . Smoking status: Current Every Day Smoker -- 1.00 packs/day  . Smokeless tobacco: Never Used  . Alcohol Use: No    Review of Systems  All other systems reviewed and are negative.     Allergies  Review of patient's allergies indicates no known allergies.  Home Medications   Prior to Admission medications   Medication Sig Start Date End Date Taking? Authorizing Provider  HYDROcodone-acetaminophen (NORCO/VICODIN) 5-325 MG tablet Take 1 tablet by mouth 2 (two) times daily as needed for severe pain. Patient not taking: Reported on 01/25/2015 01/12/15   Tomasita CrumbleAdeleke Oni, MD  meclizine (ANTIVERT) 25 MG tablet Take 1 tablet (25 mg total) by mouth 3 (three) times daily as needed for dizziness or nausea. Patient not taking: Reported on 01/25/2015 01/24/15    [name], PA-C  oxyCODONE-acetaminophen (PERCOCET/ROXICET) 5-325 MG tablet Take 1 tablet by mouth every 8 (eight) hours as needed for severe pain. Patient not taking: Reported on 01/25/2015 01/12/15   Marlon Peliffany Greene, PA-C  penicillin v potassium (VEETID) 500 MG tablet Take 1 tablet (500 mg total) by mouth 3 (three) times daily. Patient not taking: Reported on 01/25/2015 01/12/15   Marlon Peliffany Greene, PA-C   BP 143/101 mmHg  Pulse 100  Temp(Src) 98.9 F (37.2 C) (Oral)  Resp 16  SpO2 95% Physical Exam  Constitutional: He is oriented to person, place, and time. He appears well-developed and well-nourished.  He is fully dressed, pacing the room, but communicative and cooperative.  HENT:  Head: Normocephalic and atraumatic.  Right Ear: External ear normal.  Left Ear: External ear normal.  Eyes: Conjunctivae and EOM are normal. Pupils are equal, round, and reactive to light.  Neck: Normal range of motion and phonation normal. Neck supple.  Cardiovascular: Normal rate.   Pulmonary/Chest: Effort normal. He exhibits no bony tenderness.  Abdominal: He exhibits no distension.  Musculoskeletal: Normal range of motion.  Neurological: He is alert and oriented to person, place, and time. No cranial nerve deficit or sensory deficit. He exhibits normal muscle tone. Coordination normal.  Skin: Skin is warm, dry and intact.  Psychiatric: He has a normal mood and affect. His behavior is normal. Judgment and thought content normal.  Nursing note and vitals reviewed.   ED Course  Procedures (including critical care time)  21:25- patient states the longer needs assistance and he knows that his  testing was done last night and was normal. He also states that he has the pain work, which is given to him last night about seeking counseling and further treatment as needed.  Labs Review Labs Reviewed  CBC - Abnormal; Notable for the following:    WBC 11.2 (*)    All other components within normal limits  CBG  MONITORING, ED - Abnormal; Notable for the following:    Glucose-Capillary 106 (*)    All other components within normal limits  BASIC METABOLIC PANEL    Imaging Review Dg Cervical Spine Complete  01/24/2015  CLINICAL DATA:  Syncopal episode at work today. Fell bowel grade and hit posterior back of head on rubber mat. Posterior neck pain. Decreased range of motion. Initial encounter. EXAM: CERVICAL SPINE - COMPLETE 4+ VIEW COMPARISON:  None. FINDINGS: The prevertebral soft tissues are within normal limits. There straightening and some reversal of the normal cervical lordosis. Cervical spine is visualized from skull base through C6-7 on the lateral view. C7-T1 is seen on the swimmer's view and is in normal alignment. Mild uncovertebral spurring is evident at C4-5 and C5-6, right greater than left. Mild osseous foraminal narrowing is present on the right at these 2 levels. The lung apices are clear. IMPRESSION: 1. No acute fracture or traumatic subluxation. 2. Mild degenerative changes at C4-5 and C5-6 as detailed above. Electronically Signed   By: Marin Roberts M.D.   On: 01/24/2015 12:19   Ct Head Wo Contrast  01/24/2015  CLINICAL DATA:  Syncope and collapse EXAM: CT HEAD WITHOUT CONTRAST TECHNIQUE: Contiguous axial images were obtained from the base of the skull through the vertex without intravenous contrast. COMPARISON:  None. FINDINGS: No skull fracture is noted. There is mucosal thickening with almost complete opacification right maxillary sinus. Mild mucosal thickening ethmoid air cells. The mastoid air cells are unremarkable. No intracranial hemorrhage, mass effect or midline shift. No acute cortical infarction. No mass lesion is noted on this unenhanced scan. The gray and white-matter differentiation is preserved. No acute cortical infarction. No mass lesion is noted on this unenhanced scan. No hydrocephalus. IMPRESSION: No acute intracranial abnormality. Paranasal sinuses disease as  described above. Electronically Signed   By: Natasha Mead M.D.   On: 01/24/2015 12:45   I have personally reviewed and evaluated these images and lab results as part of my medical decision-making.   EKG Interpretation   Date/Time:  Friday January 25 2015 20:28:55 EST Ventricular Rate:  97 PR Interval:  141 QRS Duration: 96 QT Interval:  355 QTC Calculation: 451 R Axis:   63 Text Interpretation:  Sinus rhythm Probable left atrial enlargement since  last tracing no significant change Confirmed by Effie Shy  MD, Mechele Collin ([id number])  on 01/25/2015 9:11:07 PM      MDM   Final diagnoses:  Stress    Psychosocial stressors, with poor coping mechanism. No evidence for acute unstable psychiatric condition. Doubt acute metabolic,  infectious or cardiac disorder.  Nursing Notes Reviewed/ Care Coordinated Applicable Imaging Reviewed Interpretation of Laboratory Data incorporated into ED treatment  The patient appears reasonably screened and/or stabilized for discharge and I doubt any other medical condition or other [facility] requiring further screening, evaluation, or treatment in the ED at this time prior to discharge.  Plan: Home Medications- none; Home Treatments- rest; return here if the recommended treatment, does not improve the symptoms; Recommended follow up- PCP and Therapist prn     Mancel Bale, MD 01/25/15 2128

## 2015-01-25 NOTE — ED Notes (Signed)
This RN went to review d/c paperwork with patient, but pt observed walking out of ED stating he would just like to leave. Pt observed walking out of ED with steady gait, NAD.

## 2015-07-23 ENCOUNTER — Emergency Department (HOSPITAL_COMMUNITY)
Admission: EM | Admit: 2015-07-23 | Discharge: 2015-07-23 | Disposition: A | Discharge status: Home / Self Care | Payer: Self-pay | Attending: Emergency Medicine | Admitting: Emergency Medicine

## 2015-07-23 ENCOUNTER — Encounter (HOSPITAL_COMMUNITY): Payer: Self-pay | Admitting: Emergency Medicine

## 2015-07-23 DIAGNOSIS — H109 Unspecified conjunctivitis: Secondary | ICD-10-CM | POA: Insufficient documentation

## 2015-07-23 DIAGNOSIS — Z8659 Personal history of other mental and behavioral disorders: Secondary | ICD-10-CM | POA: Insufficient documentation

## 2015-07-23 DIAGNOSIS — J3489 Other specified disorders of nose and nasal sinuses: Secondary | ICD-10-CM | POA: Insufficient documentation

## 2015-07-23 DIAGNOSIS — F17200 Nicotine dependence, unspecified, uncomplicated: Secondary | ICD-10-CM | POA: Insufficient documentation

## 2015-07-23 MED ORDER — FLUORESCEIN SODIUM 1 MG OP STRP
1.0000 | ORAL_STRIP | Freq: Once | OPHTHALMIC | Status: AC
  Administered 2015-07-23: 1 via OPHTHALMIC
  Filled 2015-07-23: qty 1

## 2015-07-23 MED ORDER — TETRACAINE HCL 0.5 % OP SOLN
1.0000 [drp] | Freq: Once | OPHTHALMIC | Status: AC
  Administered 2015-07-23: 1 [drp] via OPHTHALMIC
  Filled 2015-07-23: qty 2

## 2015-07-23 MED ORDER — POLYMYXIN B-TRIMETHOPRIM 10000-0.1 UNIT/ML-% OP SOLN
2.0000 [drp] | Freq: Once | OPHTHALMIC | Status: AC
  Administered 2015-07-23: 2 [drp] via OPHTHALMIC
  Filled 2015-07-23: qty 10

## 2015-07-23 NOTE — Discharge Instructions (Signed)
Use your eye drops every 4 hours while awake for 7 days.  Bacterial Conjunctivitis Bacterial conjunctivitis, commonly called pink eye, is an inflammation of the clear membrane that covers the white part of the eye (conjunctiva). The inflammation can also happen on the underside of the eyelids. The blood vessels in the conjunctiva become inflamed, causing the eye to become red or pink. Bacterial conjunctivitis may spread easily from one eye to another and from person to person (contagious).  CAUSES  Bacterial conjunctivitis is caused by bacteria. The bacteria may come from your own skin, your upper respiratory tract, or from someone else with bacterial conjunctivitis. SYMPTOMS  The normally white color of the eye or the underside of the eyelid is usually pink or red. The pink eye is usually associated with irritation, tearing, and some sensitivity to light. Bacterial conjunctivitis is often associated with a thick, yellowish discharge from the eye. The discharge may turn into a crust on the eyelids overnight, which causes your eyelids to stick together. If a discharge is present, there may also be some blurred vision in the affected eye. DIAGNOSIS  Bacterial conjunctivitis is diagnosed by your caregiver through an eye exam and the symptoms that you report. Your caregiver looks for changes in the surface tissues of your eyes, which may point to the specific type of conjunctivitis. A sample of any discharge may be collected on a cotton-tip swab if you have a severe case of conjunctivitis, if your cornea is affected, or if you keep getting repeat infections that do not respond to treatment. The sample will be sent to a lab to see if the inflammation is caused by a bacterial infection and to see if the infection will respond to antibiotic medicines. TREATMENT   Bacterial conjunctivitis is treated with antibiotics. Antibiotic eyedrops are most often used. However, antibiotic ointments are also available.  Antibiotics pills are sometimes used. Artificial tears or eye washes may ease discomfort. HOME CARE INSTRUCTIONS   To ease discomfort, apply a cool, clean washcloth to your eye for 10-20 minutes, 3-4 times a day.  Gently wipe away any drainage from your eye with a warm, wet washcloth or a cotton ball.  Wash your hands often with soap and water. Use paper towels to dry your hands.  Do not share towels or washcloths. This may spread the infection.  Change or wash your pillowcase every day.  You should not use eye makeup until the infection is gone.  Do not operate machinery or drive if your vision is blurred.  Stop using contact lenses. Ask your caregiver how to sterilize or replace your contacts before using them again. This depends on the type of contact lenses that you use.  When applying medicine to the infected eye, do not touch the edge of your eyelid with the eyedrop bottle or ointment tube. SEEK IMMEDIATE MEDICAL CARE IF:   Your infection has not improved within 3 days after beginning treatment.  You had yellow discharge from your eye and it returns.  You have increased eye pain.  Your eye redness is spreading.  Your vision becomes blurred.  You have a fever or persistent symptoms for more than 2-3 days.  You have a fever and your symptoms suddenly get worse.  You have facial pain, redness, or swelling. MAKE SURE YOU:   Understand these instructions.  Will watch your condition.  Will get help right away if you are not doing well or get worse.   This information is not intended to  replace advice given to you by your health care provider. Make sure you discuss any questions you have with your health care provider.   Document Released: 02/23/2005 Document Revised: 03/16/2014 Document Reviewed: 07/27/2011 Elsevier Interactive Patient Education [employer].

## 2015-07-23 NOTE — ED Notes (Signed)
Started yesterday with right eye irritation. Woke this am with worsening sx. Left eye also slightly red and irritated. No known injury.

## 2015-07-23 NOTE — ED Provider Notes (Signed)
CSN: [id number]     Arrival date & time 07/23/15  [id number] History  By signing my name below, I, Placido SouLogan Joldersma, attest that this documentation has been prepared under the direction and in the presence of Sealed Air CorporationHeather Manahil Vanzile, PA-C. Electronically Signed: Placido SouLogan Joldersma, ED Scribe. 07/23/2015. 9:33 AM.   No chief complaint on file.  The history is provided by the patient. No language interpreter was used.    HPI Comments: Paul Macdonald is a 35 y.o. male who presents to the Emergency Department complaining of worsening, mild, right eye redness and irritation x 1 day. He states that it began with dry eyes and itchiness yesterday morning which worsened progressively to including moderate right eyelid swelling, right eye discharge, mild right eye pain, mild blurry vision and mild rhinorrhea. Pt reports mild left eye watering but denies any other left eye symptoms. Pt denies any acute injury or trauma.   Pt reports his friend was dx with pink eye months ago but denies any recent known sick contacts. Pt reports a hx of smoking. He denies fevers or any other associated symptoms at this time.   Past Medical History  Diagnosis Date  . Depression    No past surgical history on file. No family history on file. Social History  Substance Use Topics  . Smoking status: Current Every Day Smoker -- 1.00 packs/day  . Smokeless tobacco: Never Used  . Alcohol Use: No    Review of Systems  Constitutional: Negative for fever and chills.  HENT: Positive for rhinorrhea.   Eyes: Positive for pain, discharge, redness, itching and visual disturbance.    Allergies  Review of patient's allergies indicates no known allergies.  Home Medications   Prior to Admission medications   Medication Sig Start Date End Date Taking? Authorizing Provider  HYDROcodone-acetaminophen (NORCO/VICODIN) 5-325 MG tablet Take 1 tablet by mouth 2 (two) times daily as needed for severe pain. Patient not taking: Reported on 01/25/2015  01/12/15   Tomasita CrumbleAdeleke Oni, MD  meclizine (ANTIVERT) 25 MG tablet Take 1 tablet (25 mg total) by mouth 3 (three) times daily as needed for dizziness or nausea. Patient not taking: Reported on 01/25/2015 01/24/15   [name], PA-C  oxyCODONE-acetaminophen (PERCOCET/ROXICET) 5-325 MG tablet Take 1 tablet by mouth every 8 (eight) hours as needed for severe pain. Patient not taking: Reported on 01/25/2015 01/12/15   Marlon Peliffany Greene, PA-C  penicillin v potassium (VEETID) 500 MG tablet Take 1 tablet (500 mg total) by mouth 3 (three) times daily. Patient not taking: Reported on 01/25/2015 01/12/15   Marlon Peliffany Greene, PA-C   BP 122/98 mmHg  Pulse 62  Temp(Src) 98.8 F (37.1 C) (Oral)  Resp 22  SpO2 97%    Physical Exam  Constitutional: He is oriented to person, place, and time. He appears well-developed and well-nourished.  HENT:  Head: Normocephalic and atraumatic.  Eyes: EOM are normal. Pupils are equal, round, and reactive to light. Lids are everted and swept, no foreign bodies found. Right conjunctiva is injected. Right eye exhibits normal extraocular motion. Left eye exhibits normal extraocular motion.  Lids everted and no foreign bodies visualized; injection of the right sclera; PERRL; no pain with EOM Visual Acuity: Right Eye Distance: 20/30 Left Eye Distance: 20/50 Bilateral Distance: 20/30  Neck: Normal range of motion.  Cardiovascular: Normal rate.   Pulmonary/Chest: Effort normal. No respiratory distress.  Abdominal: Soft.  Musculoskeletal: Normal range of motion.  Neurological: He is alert and oriented to person, place, and time.  Skin: Skin  is warm and dry.  Psychiatric: He has a normal mood and affect.  Nursing note and vitals reviewed.   ED Course  Procedures  DIAGNOSTIC STUDIES: Oxygen Saturation is 97% on RA, normal by my interpretation.    COORDINATION OF CARE: 9:31 AM Discussed next steps with pt. He verbalized understanding and is agreeable with the plan.   Labs  Review Labs Reviewed - No data to display  Imaging Review No results found.   EKG Interpretation None      MDM   Final diagnoses:  None   Patient presents today with irritation and redness of the right eye. No acute injury or trauma.  Visual acuity WNL.  He reports associated crusting and discharge of the eye. Feel that symptoms are most consistent with Bacterial Conjunctivitis.  Patient given antibiotic eye drops.  Stable for discharge.  Return precautions given.  I personally performed the services described in this documentation, which was scribed in my presence. The recorded information has been reviewed and is accurate.   Santiago Glad, PA-C 07/24/15 2127  Leta Baptist, MD 08/04/15 [phone]

## 2015-11-27 ENCOUNTER — Encounter (HOSPITAL_COMMUNITY): Payer: Self-pay | Admitting: Emergency Medicine

## 2015-11-27 ENCOUNTER — Emergency Department (HOSPITAL_COMMUNITY)
Admission: EM | Admit: 2015-11-27 | Discharge: 2015-11-27 | Disposition: A | Discharge status: Home / Self Care | Payer: Self-pay | Attending: Dermatology | Admitting: Dermatology

## 2015-11-27 DIAGNOSIS — F17200 Nicotine dependence, unspecified, uncomplicated: Secondary | ICD-10-CM | POA: Insufficient documentation

## 2015-11-27 DIAGNOSIS — Z792 Long term (current) use of antibiotics: Secondary | ICD-10-CM | POA: Insufficient documentation

## 2015-11-27 DIAGNOSIS — Z5321 Procedure and treatment not carried out due to patient leaving prior to being seen by health care provider: Secondary | ICD-10-CM | POA: Insufficient documentation

## 2015-11-27 DIAGNOSIS — Z79899 Other long term (current) drug therapy: Secondary | ICD-10-CM | POA: Insufficient documentation

## 2015-11-27 DIAGNOSIS — R55 Syncope and collapse: Secondary | ICD-10-CM | POA: Insufficient documentation

## 2015-11-27 NOTE — ED Triage Notes (Signed)
Pt BIB [facility] and states that he had a headache today and passed out at a meeting. States he had a similar situation last week but did not pass out completely. Alert and oriented. Neuro intact.

## 2015-11-28 LAB — CBG MONITORING, ED: GLUCOSE-CAPILLARY: 137 mg/dL — AB (ref 65–99)

## 2016-01-05 ENCOUNTER — Emergency Department (HOSPITAL_COMMUNITY)
Admission: EM | Admit: 2016-01-05 | Discharge: 2016-01-05 | Disposition: A | Discharge status: Home / Self Care | Payer: Self-pay | Attending: Emergency Medicine | Admitting: Emergency Medicine

## 2016-01-05 ENCOUNTER — Emergency Department (HOSPITAL_COMMUNITY): Payer: Self-pay

## 2016-01-05 ENCOUNTER — Encounter (HOSPITAL_COMMUNITY): Payer: Self-pay

## 2016-01-05 DIAGNOSIS — H538 Other visual disturbances: Secondary | ICD-10-CM | POA: Insufficient documentation

## 2016-01-05 DIAGNOSIS — F17200 Nicotine dependence, unspecified, uncomplicated: Secondary | ICD-10-CM | POA: Insufficient documentation

## 2016-01-05 DIAGNOSIS — R42 Dizziness and giddiness: Secondary | ICD-10-CM | POA: Insufficient documentation

## 2016-01-05 LAB — CBC WITH DIFFERENTIAL/PLATELET
BASOS ABS: 0 10*3/uL (ref 0.0–0.1)
BASOS PCT: 0 %
Eosinophils Absolute: 0.2 10*3/uL (ref 0.0–0.7)
Eosinophils Relative: 2 %
HEMATOCRIT: 44.5 % (ref 39.0–52.0)
HEMOGLOBIN: 14.8 g/dL (ref 13.0–17.0)
Lymphocytes Relative: 28 %
Lymphs Abs: 2.5 10*3/uL (ref 0.7–4.0)
MCH: 30.5 pg (ref 26.0–34.0)
MCHC: 33.3 g/dL (ref 30.0–36.0)
MCV: 91.6 fL (ref 78.0–100.0)
MONO ABS: 0.6 10*3/uL (ref 0.1–1.0)
Monocytes Relative: 7 %
NEUTROS ABS: 5.7 10*3/uL (ref 1.7–7.7)
NEUTROS PCT: 63 %
Platelets: 294 10*3/uL (ref 150–400)
RBC: 4.86 MIL/uL (ref 4.22–5.81)
RDW: 12.8 % (ref 11.5–15.5)
WBC: 9 10*3/uL (ref 4.0–10.5)

## 2016-01-05 LAB — COMPREHENSIVE METABOLIC PANEL
ALBUMIN: 4.3 g/dL (ref 3.5–5.0)
ALT: 52 U/L (ref 17–63)
AST: 28 U/L (ref 15–41)
Alkaline Phosphatase: 60 U/L (ref 38–126)
Anion gap: 9 (ref 5–15)
BILIRUBIN TOTAL: 0.7 mg/dL (ref 0.3–1.2)
BUN: 16 mg/dL (ref 6–20)
CO2: 25 mmol/L (ref 22–32)
Calcium: 9.2 mg/dL (ref 8.9–10.3)
Chloride: 106 mmol/L (ref 101–111)
Creatinine, Ser: 0.91 mg/dL (ref 0.61–1.24)
GFR calc Af Amer: >60 mL/min (ref >60)
GFR calc non Af Amer: >60 mL/min (ref >60)
GLUCOSE: 101 mg/dL — AB (ref 65–99)
POTASSIUM: 4.1 mmol/L (ref 3.5–5.1)
Sodium: 140 mmol/L (ref 135–145)
TOTAL PROTEIN: 7.2 g/dL (ref 6.5–8.1)

## 2016-01-05 LAB — TROPONIN I: Troponin I: <0.03 ng/mL (ref <0.03)

## 2016-01-05 MED ORDER — LORAZEPAM 1 MG PO TABS
1.0000 mg | ORAL_TABLET | Freq: Once | ORAL | Status: AC
  Administered 2016-01-05: 1 mg via ORAL
  Filled 2016-01-05: qty 1

## 2016-01-05 NOTE — ED Notes (Signed)
Unable to collect labs patient is with xray

## 2016-01-05 NOTE — Discharge Instructions (Signed)
Test showed no life-threatening condition. Recommend weight loss and exercise.  Eat healthy meals.  Try to get a primary care doctor.

## 2016-01-05 NOTE — ED Triage Notes (Addendum)
PT C/O DIZZINESS, BLURRED VISION, AND HAND TREMORS SINCE WAKING UP THIS MORNING. PT STS THIS IS A RECURRENT PROBLEM, STS THE LAST TIME THIS HAPPENED, HE HAD A SYNCOPAL EPISODE, AND WAS DX'D WITH ANXIETY AND REFERRED TO A PSYCHIATRIST, BUT HE GOT UPSET AND LEFT BEFORE SEEING THEM.

## 2016-01-08 NOTE — ED Provider Notes (Signed)
[facility]-EMERGENCY DEPT Provider Note   CSN: [id number] Arrival date & time: 01/05/16  1400     History   Chief Complaint Chief Complaint  Patient presents with  . Dizziness  . Tremors    HPI Paul Macdonald is a 35 y.o. male.  Dizziness, blurred vision, tremulous hands, feeling like almost passing out at work this morning. The symptoms have happened before. He claims to eat poorly and be under lots of stress. 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 He is feeling better now. No one time he was diagnosed with anxiety and referred to a psychiatrist, but did not follow through. His blood pressure is borderline.      Past Medical History:  Diagnosis Date  . Depression     There are no active problems to display for this patient.   Past Surgical History:  Procedure Laterality Date  . 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 Authorizing Provider  ibuprofen (ADVIL,MOTRIN) 200 MG tablet Take 600 mg by mouth every 6 (six) hours as needed for mild pain or moderate pain.   Yes Historical Provider, MD  HYDROcodone-acetaminophen (NORCO/VICODIN) 5-325 MG tablet Take 1 tablet by mouth 2 (two) times daily as needed for severe pain. Patient not taking: Reported on 01/05/2016 01/12/15   Tomasita CrumbleAdeleke Oni, MD  meclizine (ANTIVERT) 25 MG tablet Take 1 tablet (25 mg total) by mouth 3 (three) times daily as needed for dizziness or nausea. Patient not taking: Reported on 01/25/2015 01/24/15   [name], PA-C  oxyCODONE-acetaminophen (PERCOCET/ROXICET) 5-325 MG tablet Take 1 tablet by mouth every 8 (eight) hours as needed for severe pain. Patient not taking: Reported on 01/25/2015 01/12/15   Marlon Peliffany Greene, PA-C  penicillin v potassium (VEETID) 500 MG tablet Take 1 tablet (500 mg total) by mouth 3 (three) times daily. Patient not taking: Reported on  01/25/2015 01/12/15   Marlon Peliffany Greene, PA-C    Family History History reviewed. No pertinent family history.  Social History Social History  Substance Use Topics  . Smoking status: Current Every Day Smoker    Packs/day: 1.00  . Smokeless tobacco: Never Used  . Alcohol use No     Allergies   Review of patient's allergies indicates no known allergies.   Review of Systems Review of Systems  All other systems reviewed and are negative.    Physical Exam Updated Vital Signs BP 132/100 (BP Location: Right Arm)   Pulse 84   Temp 98.4 F (36.9 C) (Oral)   Resp 18   Ht 6\' 2"  (1.88 m)   Wt (!) 320 lb (145.2 kg)   SpO2 97%   BMI 41.09 kg/m   Physical Exam  Constitutional: He is oriented to person, place, and time.  Ambulatory, no acute distress, no neuro deficits.  HENT:  Head: Normocephalic and atraumatic.  Eyes: Conjunctivae are normal.  Neck: Neck supple.  Cardiovascular: Normal rate and regular rhythm.   Pulmonary/Chest: Effort normal and breath sounds normal.  Abdominal: Soft. Bowel sounds are normal.  Musculoskeletal: Normal range of motion.  Neurological: He is alert and oriented to person, place, and time.  Skin: Skin is warm and dry.  Psychiatric: He has a normal mood and affect. His behavior is normal.  Nursing note and vitals reviewed.    ED Treatments /  Results  Labs (all labs ordered are listed, but only abnormal results are displayed) Labs Reviewed  COMPREHENSIVE METABOLIC PANEL - Abnormal; Notable for the following:       Result Value   Glucose, Bld 101 (*)    All other components within normal limits  CBC WITH DIFFERENTIAL/PLATELET  TROPONIN I    EKG  EKG Interpretation  Date/Time:  Sunday January 05 2016 15:41:37 EDT Ventricular Rate:  71 PR Interval:    QRS Duration: 94 QT Interval:  397 QTC Calculation: 432 R Axis:   50 Text Interpretation:  Sinus rhythm Confirmed by Adriana SimasOOK  MD, Harlem Thresher ([id number]) on 01/05/2016 3:48:58 PM        Radiology No results found.  Procedures Procedures (including critical care time)  Medications Ordered in ED Medications  LORazepam (ATIVAN) tablet 1 mg (1 mg Oral Given 01/05/16 1533)     Initial Impression / Assessment and Plan / ED Course  I have reviewed the triage vital signs and the nursing notes.  Pertinent labs & imaging results that were available during my care of the patient were reviewed by me and considered in my medical decision making (see chart for details).  Clinical Course    Patient is no acute distress. Labs, EKG, CT head all negative. Patient is an which were without neurological deficits.  Final Clinical Impressions(s) / ED Diagnoses   Final diagnoses:  Dizziness  Blurred vision    New Prescriptions Discharge Medication List as of 01/05/2016  5:12 PM       Donnetta HutchingBrian Matilda Fleig, MD 01/08/16 1410

## 2016-01-23 DIAGNOSIS — F410 Panic disorder [episodic paroxysmal anxiety] without agoraphobia: Secondary | ICD-10-CM | POA: Insufficient documentation

## 2016-01-23 DIAGNOSIS — L409 Psoriasis, unspecified: Secondary | ICD-10-CM | POA: Insufficient documentation

## 2016-01-23 DIAGNOSIS — F1121 Opioid dependence, in remission: Secondary | ICD-10-CM | POA: Insufficient documentation

## 2016-01-23 DIAGNOSIS — R55 Syncope and collapse: Secondary | ICD-10-CM | POA: Insufficient documentation

## 2016-01-24 DIAGNOSIS — R7309 Other abnormal glucose: Secondary | ICD-10-CM | POA: Insufficient documentation

## 2016-02-15 ENCOUNTER — Emergency Department (HOSPITAL_COMMUNITY)
Admission: EM | Admit: 2016-02-15 | Discharge: 2016-02-15 | Disposition: A | Discharge status: Home / Self Care | Payer: Self-pay | Attending: Emergency Medicine | Admitting: Emergency Medicine

## 2016-02-15 ENCOUNTER — Encounter (HOSPITAL_COMMUNITY): Payer: Self-pay | Admitting: Emergency Medicine

## 2016-02-15 DIAGNOSIS — K029 Dental caries, unspecified: Secondary | ICD-10-CM | POA: Insufficient documentation

## 2016-02-15 DIAGNOSIS — F17200 Nicotine dependence, unspecified, uncomplicated: Secondary | ICD-10-CM | POA: Insufficient documentation

## 2016-02-15 MED ORDER — PENICILLIN V POTASSIUM 500 MG PO TABS: 500.0000 mg | ORAL_TABLET | Freq: Three times a day (TID) | ORAL | 0 refills | Status: DC

## 2016-02-15 MED ORDER — NAPROXEN 500 MG PO TABS: 500.0000 mg | ORAL_TABLET | Freq: Two times a day (BID) | ORAL | 0 refills | Status: DC

## 2016-02-15 MED ORDER — BUPIVACAINE-EPINEPHRINE (PF) 0.5% -1:200000 IJ SOLN
1.8000 mL | Freq: Once | INTRAMUSCULAR | Status: AC
  Administered 2016-02-15: 1.8 mL
  Filled 2016-02-15: qty 1.8

## 2016-02-15 NOTE — ED Provider Notes (Signed)
[facility] DEPT Provider Note   CSN: [id number] Arrival date & time: 02/15/16  1209  By signing my name below, I, Teofilo PodMatthew P. Jamison, attest that this documentation has been prepared under the direction and in the presence of Fayrene HelperBowie Neizan Debruhl, PA-C. Electronically Signed: Teofilo PodMatthew P. Jamison, ED Scribe. 02/15/2016. 12:37 PM.    History   Chief Complaint Chief Complaint  Patient presents with  . Dental Pain    The history is provided by the patient. No language interpreter was used.   HPI Comments:  Paul Macdonald is a 35 y.o. male who presents to the Emergency Department complaining of worsening right lower dental pain x 2 weeks. Pt states that his molar broke 2 weeks ago, and then it broke even more 4 days ago. Pt describes the pain as "throbbing" to the entire right side of his face. Pt complains of associated ear pain. Pt notes previous history of dental abscesses. Pt reports that he has an appointment to have the tooth extracted on Wednesday. Pt has taken ibuprofen and has used a gel with mild temporary relief. Pt denies fever.   Past Medical History:  Diagnosis Date  . Depression     There are no active problems to display for this patient.   Past Surgical History:  Procedure Laterality Date  . 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 Authorizing Provider  HYDROcodone-acetaminophen (NORCO/VICODIN) 5-325 MG tablet Take 1 tablet by mouth 2 (two) times daily as needed for severe pain. Patient not taking: Reported on 01/05/2016 01/12/15   Tomasita CrumbleAdeleke Oni, MD  ibuprofen (ADVIL,MOTRIN) 200 MG tablet Take 600 mg by mouth every 6 (six) hours as needed for mild pain or moderate pain.    Historical Provider, MD  meclizine (ANTIVERT) 25 MG tablet Take 1 tablet (25 mg total) by mouth 3 (three) times daily as needed for dizziness or  nausea. Patient not taking: Reported on 01/25/2015 01/24/15   Fayrene HelperBowie Abdikadir Fohl, PA-C  oxyCODONE-acetaminophen (PERCOCET/ROXICET) 5-325 MG tablet Take 1 tablet by mouth every 8 (eight) hours as needed for severe pain. Patient not taking: Reported on 01/25/2015 01/12/15   Marlon Peliffany Greene, PA-C  penicillin v potassium (VEETID) 500 MG tablet Take 1 tablet (500 mg total) by mouth 3 (three) times daily. Patient not taking: Reported on 01/25/2015 01/12/15   Marlon Peliffany Greene, PA-C    Family History History reviewed. No pertinent family history.  Social History Social History  Substance Use Topics  . Smoking status: Current Every Day Smoker    Packs/day: 1.00  . Smokeless tobacco: Never Used  . Alcohol use No     Allergies   Patient has no known allergies.   Review of Systems Review of Systems  Constitutional: Negative for fever.  HENT: Positive for dental problem and ear pain.      Physical Exam Updated Vital Signs BP 103/69 (BP Location: Left Arm)   Pulse 90   Temp 98 F (36.7 C) (Oral)   Resp 18   Ht 6\' 2"  (1.88 m)   Wt (!) 320 lb (145.2 kg)   SpO2 97%   BMI 41.09 kg/m   Physical Exam  Constitutional: He appears well-developed and well-nourished. No distress.  HENT:  Head: Normocephalic and atraumatic.  Significant dental decay to tooth #31. TTP without gingival erythema. No trismus  or abscess noted. No facial involvement.   Eyes: Conjunctivae are normal.  Cardiovascular: Normal rate.   Pulmonary/Chest: Effort normal.  Abdominal: He exhibits no distension.  Neurological: He is alert.  Skin: Skin is warm and dry.  Psychiatric: He has a normal mood and affect.  Nursing note and vitals reviewed.    ED Treatments / Results  DIAGNOSTIC STUDIES:  Oxygen Saturation is 97% on RA, normal by my interpretation.    COORDINATION OF CARE:  12:37 PM Discussed treatment plan with pt at bedside and pt agreed to plan.   Labs (all labs ordered are listed, but only abnormal results  are displayed) Labs Reviewed - No data to display  EKG  EKG Interpretation None       Radiology No results found.  Procedures .Nerve Block Date/Time: 02/15/2016 12:51 PM Performed by: Fayrene HelperRAN, Jovita Persing Authorized by: Fayrene HelperRAN, Nicolet Griffy   Consent:    Consent obtained:  Verbal   Consent given by:  Patient   Risks discussed:  Pain   Alternatives discussed:  No treatment and referral Indications:    Indications:  Pain relief Location:    Body area:  Head (mouth)   Head nerve blocked: right inferior alveolar nerve.   Laterality:  Right Procedure details (see MAR for exact dosages):    Block needle gauge:  27 G   Anesthetic injected:  Bupivacaine 0.5% WITH epi   Steroid injected:  None   Injection procedure:  Anatomic landmarks identified, negative aspiration for blood, incremental injection, anatomic landmarks palpated and introduced needle   Paresthesia:  Prolonged Post-procedure details:    Dressing:  None   Outcome:  Anesthesia achieved   Patient tolerance of procedure:  Tolerated well, no immediate complications   (including critical care time)    Medications Ordered in ED Medications - No data to display   Initial Impression / Assessment and Plan / ED Course  Patient with dentalgia.  No abscess requiring immediate incision and drainage.  Exam not concerning for Ludwig's angina or pharyngeal abscess.  Will treat with inferior alveolar nerve block, abx and NSAIDs. Pt instructed to follow-up with dentist.  Discussed return precautions. Pt safe for discharge.  I have reviewed the triage vital signs and the nursing notes.  Pertinent labs & imaging results that were available during my care of the patient were reviewed by me and considered in my medical decision making (see chart for details).  Clinical Course     BP 103/69 (BP Location: Left Arm)   Pulse 90   Temp 98 F (36.7 C) (Oral)   Resp 18   Ht 6\' 2"  (1.88 m)   Wt (!) 145.2 kg   SpO2 97%   BMI 41.09 kg/m     Final Clinical Impressions(s) / ED Diagnoses   Final diagnoses:  Pain due to dental caries    New Prescriptions New Prescriptions   NAPROXEN (NAPROSYN) 500 MG TABLET    Take 1 tablet (500 mg total) by mouth 2 (two) times daily.   I personally performed the services described in this documentation, which was scribed in my presence. The recorded information has been reviewed and is accurate.       Fayrene HelperBowie Erendida Wrenn, PA-C 02/15/16 1254    Lorre NickAnthony Allen, MD 02/15/16 [phone]

## 2016-02-15 NOTE — ED Triage Notes (Signed)
Patient reports right lower molar dental pain x9 days. Patient reports he has an appointment to have the tooth extracted on Wednesday. Denies fevers.

## 2016-02-16 ENCOUNTER — Emergency Department (HOSPITAL_COMMUNITY)
Admission: EM | Admit: 2016-02-16 | Discharge: 2016-02-16 | Disposition: A | Discharge status: Home / Self Care | Payer: BLUE CROSS/BLUE SHIELD | Source: Home / Self Care | Attending: Emergency Medicine | Admitting: Emergency Medicine

## 2016-02-16 ENCOUNTER — Emergency Department (HOSPITAL_COMMUNITY)
Admission: EM | Admit: 2016-02-16 | Discharge: 2016-02-16 | Disposition: A | Discharge status: Home / Self Care | Payer: BLUE CROSS/BLUE SHIELD | Attending: Emergency Medicine | Admitting: Emergency Medicine

## 2016-02-16 ENCOUNTER — Encounter (HOSPITAL_COMMUNITY): Payer: Self-pay | Admitting: *Deleted

## 2016-02-16 ENCOUNTER — Encounter (HOSPITAL_COMMUNITY): Payer: Self-pay | Admitting: Oncology

## 2016-02-16 DIAGNOSIS — F17200 Nicotine dependence, unspecified, uncomplicated: Secondary | ICD-10-CM | POA: Insufficient documentation

## 2016-02-16 DIAGNOSIS — K0889 Other specified disorders of teeth and supporting structures: Secondary | ICD-10-CM | POA: Insufficient documentation

## 2016-02-16 DIAGNOSIS — K029 Dental caries, unspecified: Secondary | ICD-10-CM | POA: Diagnosis not present

## 2016-02-16 MED ORDER — HYDROCODONE-ACETAMINOPHEN 5-325 MG PO TABS
2.0000 | ORAL_TABLET | Freq: Once | ORAL | Status: AC
  Administered 2016-02-16: 2 via ORAL
  Filled 2016-02-16: qty 2

## 2016-02-16 NOTE — Discharge Instructions (Signed)
Treatment: Continue taking penicillin and your other medications at home as prescribed. Do not exceed 3500 mg of Tylenol and a 24-hour period. Do not take Naprosyn or Advil more than is prescribed. Use warm salt water swishes 3-4 times daily. You can use ice to your jaw for comfort as well. Make sure to use a towel or paper towel between ice pack and your jaw.  Follow-up: Please see a dentist as soon as possible for further treatment of your dental pain. Please return to the emergency department if you develop any new or worsening symptoms.   In addition to the resources given early today, you can also call: [facility]  [employer]  [employer] [facility] [facility]  [address]  [place], [place] [address]  Phone [phone]

## 2016-02-16 NOTE — ED Triage Notes (Signed)
Pt presents d/t right lower dental pain.  States he was seen earlier for the same.  Per pt his nerve in his tooth is exposed and return instructions stated if the pain came back severely to come back.

## 2016-02-16 NOTE — ED Provider Notes (Signed)
[facility] Provider Note   CSN: [id number] Arrival date & time: 02/16/16  2049  By signing my name below, I, Paul Macdonald, attest that this documentation has been prepared under the direction and in the presence of Paul ReamAlexandra Dafna Romo, PA-C. Electronically Signed: Alyssa GroveMartin Macdonald, ED Scribe. 02/16/16. 9:53 PM.  History   Chief Complaint Chief Complaint  Patient presents with  . Dental Pain   The history is provided by the patient. No language interpreter was used.   HPI Comments: Paul PeppersSteven A Counterman is a 35 y.o. male who presents to the Emergency Department complaining of gradual onset and worsening, constant right lower dental pain. He has been taking the prescribed Penicillin and Naproxen with no relief to pain. Pt was taking 10-12 Ibuprofen daily before yesterday's ED visit . Pt was given a marcaine injection that numbed the area for ~3 hours. Pt has a scheduled appointment to have his tooth pulled on Wednesday. Before coming to the ED, pt had tried rotating between Ibuprofen and Tylenol (3x extra strength, 1500 mg total). He reports associated mild abdominal pain due to all of the medications he has been taking. He denies fever, chest pain, shortness of breath.    Past Medical History:  Diagnosis Date  . Depression     There are no active problems to display for this patient.   Past Surgical History:  Procedure Laterality Date  . 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 Authorizing Provider  naproxen (NAPROSYN) 500 MG tablet Take 1 tablet (500 mg total) by mouth 2 (two) times daily. 02/15/16   [name], PA-C  penicillin v potassium (VEETID) 500 MG tablet Take 1 tablet (500 mg total) by mouth 3 (three) times daily. 02/15/16   [name], PA-C    Family History No family history on file.  Social History Social History  Substance  Use Topics  . Smoking status: Current Every Day Smoker    Packs/day: 1.00  . Smokeless tobacco: Never Used  . Alcohol use No    Allergies   Patient has no known allergies.  Review of Systems Review of Systems  Constitutional: Negative for chills and fever.  HENT: Positive for dental problem. Negative for facial swelling and sore throat.   Respiratory: Negative for shortness of breath.   Cardiovascular: Negative for chest pain.  Gastrointestinal: Positive for abdominal pain. Negative for nausea and vomiting.  Genitourinary: Negative for dysuria.  Musculoskeletal: Negative for back pain.  Skin: Negative for rash and wound.  Neurological: Negative for headaches.  Psychiatric/Behavioral: The patient is not nervous/anxious.      Physical Exam Updated Vital Signs BP 162/92 (BP Location: Left Arm)   Pulse 103   Temp 98.6 F (37 C) (Oral)   Resp 22   SpO2 99%   Physical Exam  Constitutional: He appears well-developed and well-nourished. No distress.  HENT:  Head: Normocephalic and atraumatic.    Mouth/Throat: Oropharynx is clear and moist and mucous membranes are normal. No trismus in the jaw. Abnormal dentition. Dental caries present. No dental abscesses or uvula swelling. No oropharyngeal exudate.    Eyes: Conjunctivae are normal. Pupils are equal, round, and reactive to light. Right eye exhibits no discharge. Left eye exhibits no discharge. No scleral icterus.  Neck: Normal range of motion. Neck supple. No thyromegaly present.  No  cervical lymph nodes or masses palpated   Cardiovascular: Regular rhythm, normal heart sounds and intact distal pulses.  Exam reveals no gallop and no friction rub.   No murmur heard. Pulmonary/Chest: Effort normal and breath sounds normal. No stridor. No respiratory distress. He has no wheezes. He has no rales.  Abdominal: Soft. Bowel sounds are normal. He exhibits no distension. There is no tenderness. There is no rebound and no guarding.    Musculoskeletal: He exhibits no edema.  Lymphadenopathy:    He has no cervical adenopathy.  Neurological: He is alert. Coordination normal.  Skin: Skin is warm and dry. No rash noted. He is not diaphoretic. No pallor.  Psychiatric: He has a normal mood and affect.  Nursing note and vitals reviewed.  ED Treatments / Results  DIAGNOSTIC STUDIES: Oxygen Saturation is 99% on RA, normal by my interpretation.    COORDINATION OF CARE: 9:47 PM Discussed treatment plan with pt at bedside which includes 2 Norco prior to discharge and pt agreed to plan.  Labs (all labs ordered are listed, but only abnormal results are displayed) Labs Reviewed - No data to display  EKG  EKG Interpretation None       Radiology No results found.  Procedures Procedures (including critical care time)  Medications Ordered in ED Medications  HYDROcodone-acetaminophen (NORCO/VICODIN) 5-325 MG per tablet 2 tablet (not administered)     Initial Impression / Assessment and Plan / ED Course  I have reviewed the triage vital signs and the nursing notes.  Pertinent labs & imaging results that were available during my care of the patient were reviewed by me and considered in my medical decision making (see chart for details).  Clinical Course    Patient with dentalgia. No abscess requiring immediate incision and drainage. Exam not concerning for Ludwig's angina or pharyngeal abscess.  Will treat with 2 Norco in ED. He is not driving home. Pt already has prescription for Penicillin and Naproxen. Patient advised he can also alternate with Tylenol, but I warned patient not to exceed 3500 mg in a 24 hour period. I also advised the patient to only take naproxen as prescribed. Pt will call dentist in the morning for earlier appointment. Patient given additional dental resources in his prior visits, and I also gave him an additional resource. Discussed return precautions. Patient understands and agrees with plan. Pt  safe for discharge.  I personally performed the services described in this documentation, which was scribed in my presence. The recorded information has been reviewed and is accurate.   Final Clinical Impressions(s) / ED Diagnoses    Final diagnoses:  Pain, dental    New Prescriptions New Prescriptions   No medications on file     Emi Holeslexandra M Devery Odwyer, PA-C 02/16/16 2210    Jerelyn ScottMartha Linker, MD 02/16/16 2218

## 2016-02-16 NOTE — ED Triage Notes (Signed)
Pt reports right lower dental pain for the past 10 days. Pt states he is to have his tooth removed on Wednesday but cannot wait due to pain. Pt was seen yesterday and sent home with antibiotics and naproxen but states it is not helping.

## 2016-02-16 NOTE — ED Notes (Signed)
Bed: [facility] Expected date:  Expected time:  Means of arrival:  Comments: EVS

## 2016-02-16 NOTE — ED Provider Notes (Signed)
[facility] Provider Note   CSN: [id number] Arrival date & time: 02/16/16  0015  By signing my name below, I, Paul Macdonald, attest that this documentation has been prepared under the direction and in the presence of Paul Rhineonald Kama Cammarano, MD . Electronically Signed: Modena JanskyAlbert Macdonald, Scribe. 02/16/2016. 12:32 AM.  History   Chief Complaint Chief Complaint  Patient presents with  . Dental Pain   The history is provided by the patient. No language interpreter was used.  Dental Pain   This is a new problem. The current episode started more than 1 week ago. The problem occurs constantly. The problem has been rapidly worsening. The pain is moderate. He has tried acetaminophen for the symptoms. The treatment provided no relief.   HPI Comments: Paul PeppersSteven A Macdonald is a 35 y.o. male who presents to the Emergency Department complaining of constant moderate right lower dental pain that started about 10 days ago. He states that the side of his tooth broke off about a month ago and another portion broke off 3 days ago. He reports the pain has been worsening since 3 days ago. He states he came to the ED earlier today and given naproxen and penicillin with temporary relief, so he came to the ED. He reports he has a dental appointment scheduled in 3 days. He describes the pain as unrelieved by multiple doses of Advil throughout the week. He reports associated nausea from recent mediation use. He denies any fever, vomiting, or other complaints.   Past Medical History:  Diagnosis Date  . Depression     There are no active problems to display for this patient.   Past Surgical History:  Procedure Laterality Date  . 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 Authorizing Provider  naproxen (NAPROSYN) 500 MG tablet Take 1 tablet (500 mg total) by mouth 2  (two) times daily. 02/15/16   [name], PA-C  penicillin v potassium (VEETID) 500 MG tablet Take 1 tablet (500 mg total) by mouth 3 (three) times daily. 02/15/16   [name], PA-C    Family History No family history on file.  Social History Social History  Substance Use Topics  . Smoking status: Current Every Day Smoker    Packs/day: 1.00  . Smokeless tobacco: Never Used  . Alcohol use No     Allergies   Patient has no known allergies.   Review of Systems Review of Systems  Constitutional: Negative for fever.  HENT: Positive for dental problem.   Gastrointestinal: Positive for nausea. Negative for vomiting.     Physical Exam Updated Vital Signs BP (!) 155/102 (BP Location: Left Arm)   Pulse 86   Temp 98.4 F (36.9 C) (Oral)   Resp 20   Ht 6\' 2"  (1.88 m)   Wt (!) 320 lb (145.2 kg)   SpO2 97%   BMI 41.09 kg/m   Physical Exam  Nursing note and vitals reviewed. CONSTITUTIONAL: Well developed/well nourished, pt anxious HEAD AND FACE: Normocephalic/atraumatic EYES: EOMI ENMT: Mucous membranes moist.  Poor dentition.  No trismus.  No focal abscess noted. Right lower molar severely decayed, TTP NECK: supple no meningeal signs CV: S1/S2 noted, no murmurs/rubs/gallops noted LUNGS: Lungs are clear to auscultation bilaterally, no apparent distress NEURO: Pt is awake/alert, moves all extremitiesx4 EXTREMITIES:full ROM SKIN:  warm, color normal   ED Treatments / Results  DIAGNOSTIC STUDIES: Oxygen Saturation is 97% on RA, normal by my interpretation.    COORDINATION OF CARE: 12:37 AM- Pt advised of plan for treatment and pt agrees.  Labs (all labs ordered are listed, but only abnormal results are displayed) Labs Reviewed - No data to display  EKG  EKG Interpretation None       Radiology No results found.  Procedures Procedures (including critical care time)  Medications Ordered in ED Medications  HYDROcodone-acetaminophen (NORCO/VICODIN) 5-325 MG  per tablet 2 tablet (not administered)     Initial Impression / Assessment and Plan / ED Course  I have reviewed the triage vital signs and the nursing notes.    Clinical Course     Pt here with repeat ED visit for dental pain His tooth is clearly decayed/tender but no gingival abscess Will give 2 vicodin here, but no Rx at discharge and he has dental appt this Wednesday He is to continue his PCN   Final Clinical Impressions(s) / ED Diagnoses   Final diagnoses:  Dental caries    New Prescriptions Current Discharge Medication List     I personally performed the services described in this documentation, which was scribed in my presence. The recorded information has been reviewed and is accurate.        Paul Rhineonald Mirabel Ahlgren, MD 02/16/16 [phone]

## 2016-02-16 NOTE — ED Notes (Signed)
Patient d/c'd self care.  F/U reviewed.  Patient verbalized understanding. 

## 2016-02-18 ENCOUNTER — Emergency Department (HOSPITAL_COMMUNITY)
Admission: EM | Admit: 2016-02-18 | Discharge: 2016-02-18 | Disposition: A | Discharge status: Home / Self Care | Payer: BLUE CROSS/BLUE SHIELD | Attending: Emergency Medicine | Admitting: Emergency Medicine

## 2016-02-18 DIAGNOSIS — S025XXG Fracture of tooth (traumatic), subsequent encounter for fracture with delayed healing: Secondary | ICD-10-CM | POA: Diagnosis not present

## 2016-02-18 DIAGNOSIS — F17200 Nicotine dependence, unspecified, uncomplicated: Secondary | ICD-10-CM | POA: Insufficient documentation

## 2016-02-18 DIAGNOSIS — K0889 Other specified disorders of teeth and supporting structures: Secondary | ICD-10-CM

## 2016-02-18 DIAGNOSIS — X58XXXD Exposure to other specified factors, subsequent encounter: Secondary | ICD-10-CM | POA: Insufficient documentation

## 2016-02-18 DIAGNOSIS — S0993XD Unspecified injury of face, subsequent encounter: Secondary | ICD-10-CM | POA: Diagnosis present

## 2016-02-18 MED ORDER — TRAMADOL HCL 50 MG PO TABS: 50.0000 mg | ORAL_TABLET | Freq: Four times a day (QID) | ORAL | 0 refills | Status: DC | PRN

## 2016-02-18 MED ORDER — IBUPROFEN 800 MG PO TABS: 800.0000 mg | ORAL_TABLET | Freq: Three times a day (TID) | ORAL | 0 refills | Status: DC | PRN

## 2016-02-18 NOTE — ED Triage Notes (Signed)
Pt states that he has had R lower dental pain x 2 weeks. States that he has been here several times over the weekend but the naprosyn that he has is not helping with the pain. Pt has an appt with Dr. Leanord AsalFarless that we referred him to on Friday but is in too much pain. Alert and oriented.

## 2016-02-18 NOTE — Discharge Instructions (Signed)
Return here as needed.  Follow-up with the dentist as scheduled. 

## 2016-02-18 NOTE — ED Provider Notes (Signed)
[facility] Provider Note   CSN: [id number] Arrival date & time: 02/18/16  2209     History   Chief Complaint Chief Complaint  Patient presents with  . Dental Pain    HPI Paul Macdonald is a 35 y.o. male.  HPI Patient presents to the emergency department with continued dental pain.  He has been seen twice in the emergency department for lower dental pain.  Patient states that it has been ongoing over the last 2 weeks.  Patient states he is continuing to have pain despite the medications prescribed.  Patient does not have any further complaints.  He has no neck swelling, difficulty swallowing, difficulty breathing, tongue swelling, chest pain, shortness of breath or syncope.  Patient is also having no fever Past Medical History:  Diagnosis Date  . Depression     There are no active problems to display for this patient.   Past Surgical History:  Procedure Laterality Date  . 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 Authorizing Provider  naproxen (NAPROSYN) 500 MG tablet Take 1 tablet (500 mg total) by mouth 2 (two) times daily. 02/15/16   [name], PA-C  penicillin v potassium (VEETID) 500 MG tablet Take 1 tablet (500 mg total) by mouth 3 (three) times daily. 02/15/16   [name], PA-C    Family History No family history on file.  Social History Social History  Substance Use Topics  . Smoking status: Current Every Day Smoker    Packs/day: 1.00  . Smokeless tobacco: Never Used  . Alcohol use No     Allergies   Patient has no known allergies.   Review of Systems Review of Systems All other systems negative except as documented in the HPI. All pertinent positives and negatives as reviewed in the HPI.  Physical Exam Updated Vital Signs BP (!) 141/116 (BP Location: Right Arm)   Pulse 102   Temp 98.2  F (36.8 C) (Oral)   Resp 22   SpO2 96%   Physical Exam  Constitutional: He is oriented to person, place, and time. He appears well-developed and well-nourished. No distress.  HENT:  Head: Normocephalic and atraumatic.  Mouth/Throat: Uvula is midline, oropharynx is clear and moist and mucous membranes are normal. No oral lesions. No trismus in the jaw. Abnormal dentition. Dental caries present. No dental abscesses or uvula swelling.    Eyes: Pupils are equal, round, and reactive to light.  Cardiovascular: Normal rate and regular rhythm.   No murmur heard. Pulmonary/Chest: Effort normal and breath sounds normal. No respiratory distress.  Neurological: He is alert and oriented to person, place, and time.  Skin: Skin is warm and dry.  Psychiatric: He has a normal mood and affect.  Nursing note and vitals reviewed.    ED Treatments / Results  Labs (all labs ordered are listed, but only abnormal results are displayed) Labs Reviewed - No data to display  EKG  EKG Interpretation None       Radiology No results found.  Procedures Procedures (including critical care time)  Medications Ordered in ED Medications - No data to display   Initial Impression / Assessment and Plan / ED Course  I have reviewed the triage vital signs and the nursing notes.  Pertinent labs & imaging results that were  available during my care of the patient were reviewed by me and considered in my medical decision making (see chart for details).  Clinical Course     Patient will be given further pain control told to follow up as scheduled.  He mentions that he has a dental appointment for this Friday, but in previous notes it was noted to be on Wednesday.  I did look up the patient in the [place] database and there is no entries over the last 6 months. Final Clinical Impressions(s) / ED Diagnoses   Final diagnoses:  None    New Prescriptions New Prescriptions   No medications on  file     Charlestine NightChristopher Samvel Zinn, PA-C 02/18/16 2238    Derwood KaplanAnkit Nanavati, MD 02/22/16 [phone]

## 2016-02-18 NOTE — ED Notes (Signed)
Patient was alert, oriented and stable upon discharge. RN went over AVS and patient had no further questions.  

## 2016-05-26 IMAGING — CR DG CERVICAL SPINE COMPLETE 4+V
8 series · 8 of 8 positions shown · non-contrast
Comparison: None.

CLINICAL DATA: Syncopal episode at work today. Fell bowel grade and
hit posterior back of head on rubber mat. Posterior neck pain.
Decreased range of motion. Initial encounter.

EXAM:
CERVICAL SPINE - COMPLETE 4+ VIEW

[w cervical spine lat]
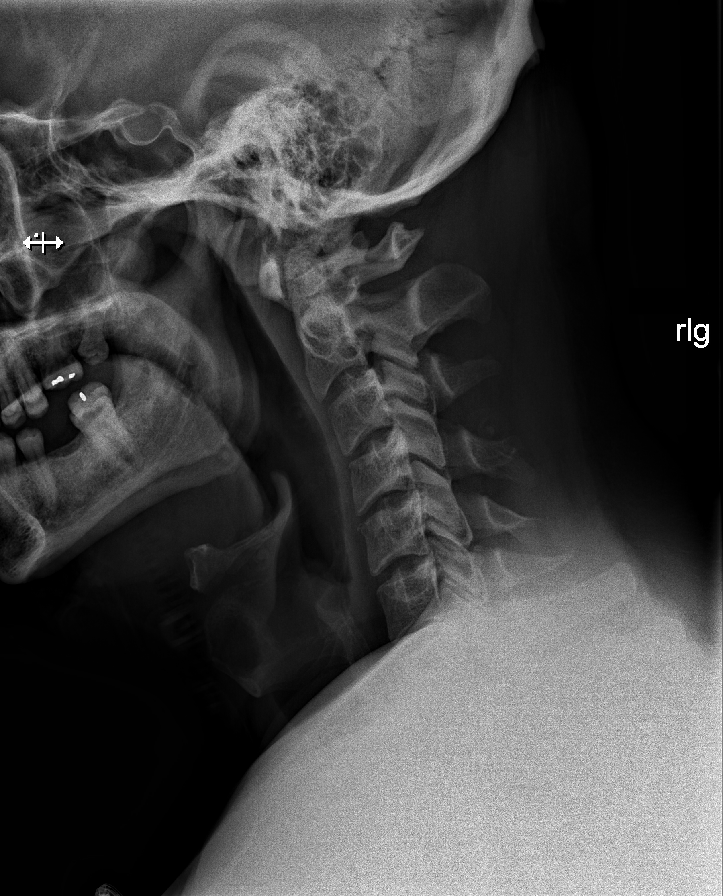

[w cervical spine ap_obl (1 of 2)]
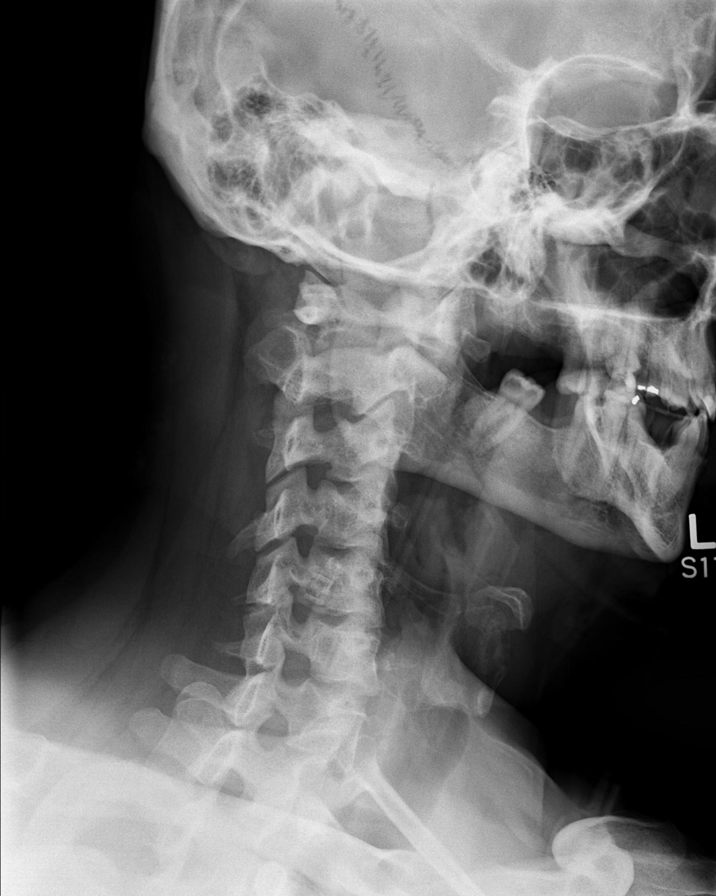

[w cervical spine ap_obl (2 of 2)]
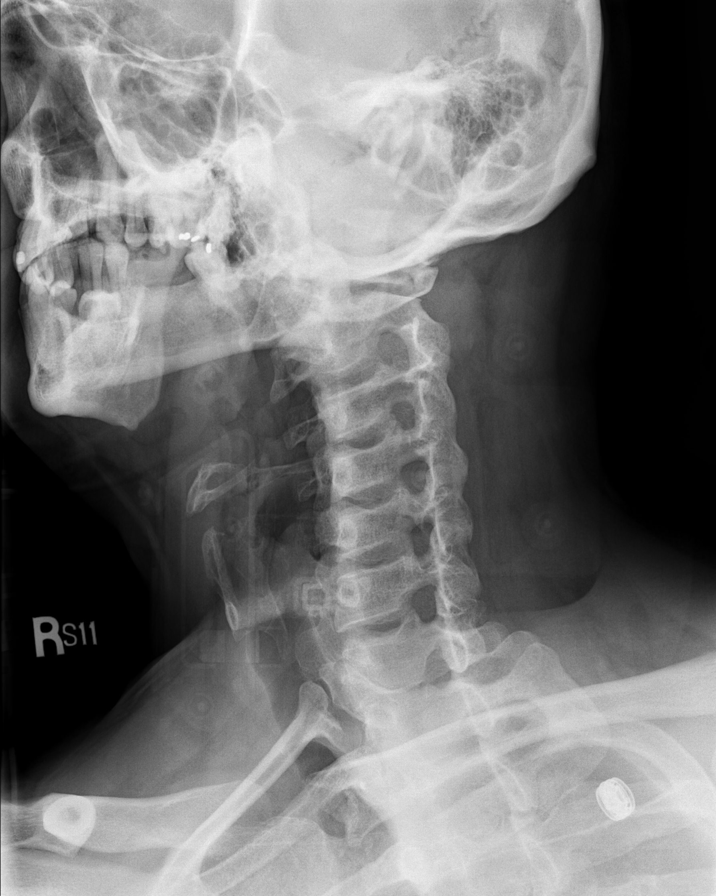

[w cervical spine ap]
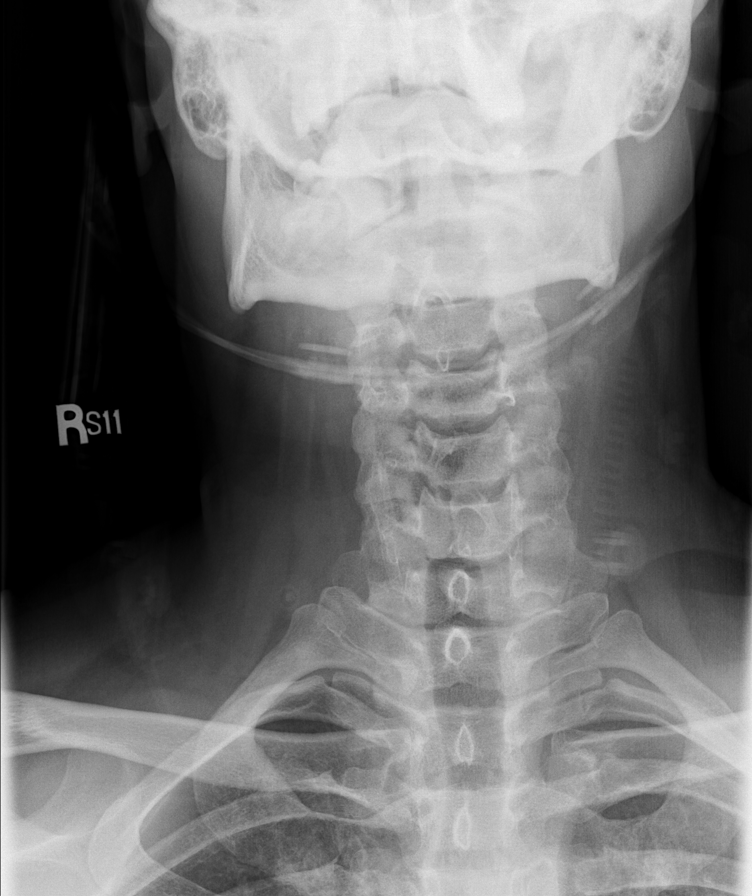

[w cervical spine odontoid (1 of 2)]
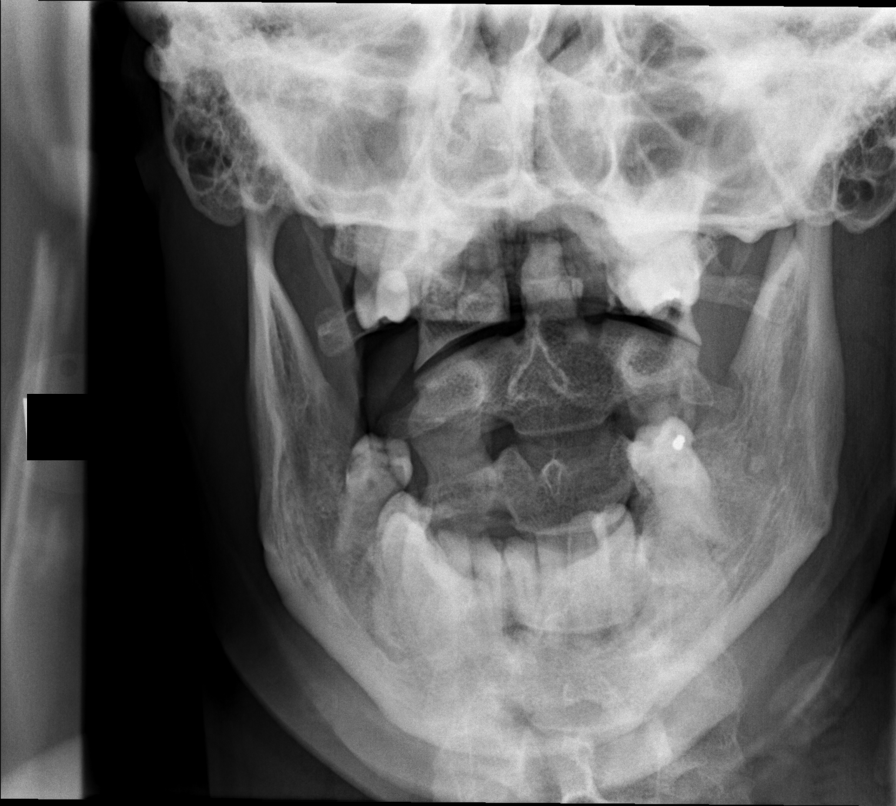

[w cervical spine odontoid (2 of 2)]
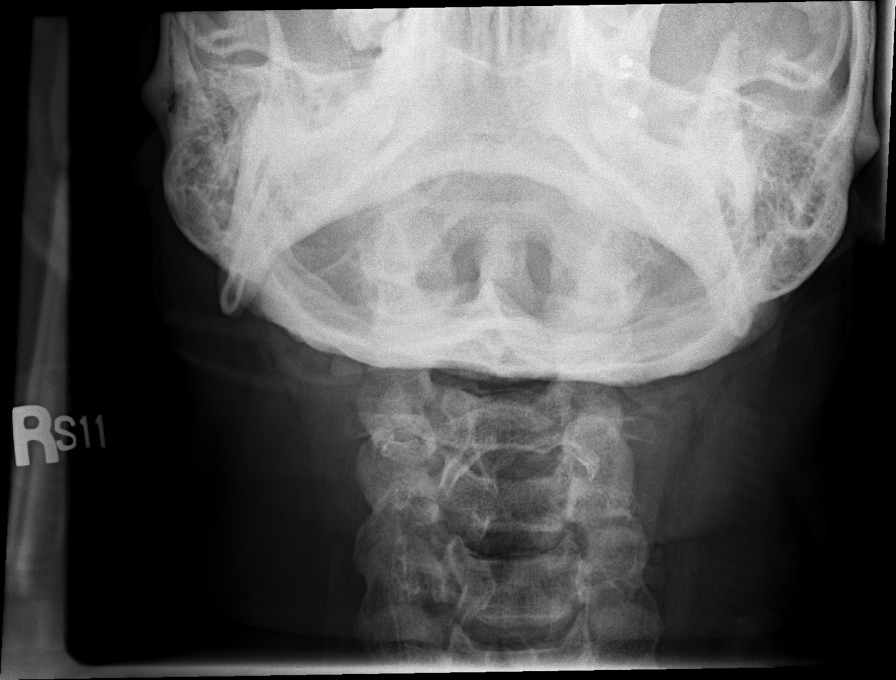

[w cervical swimmers (1 of 2)]
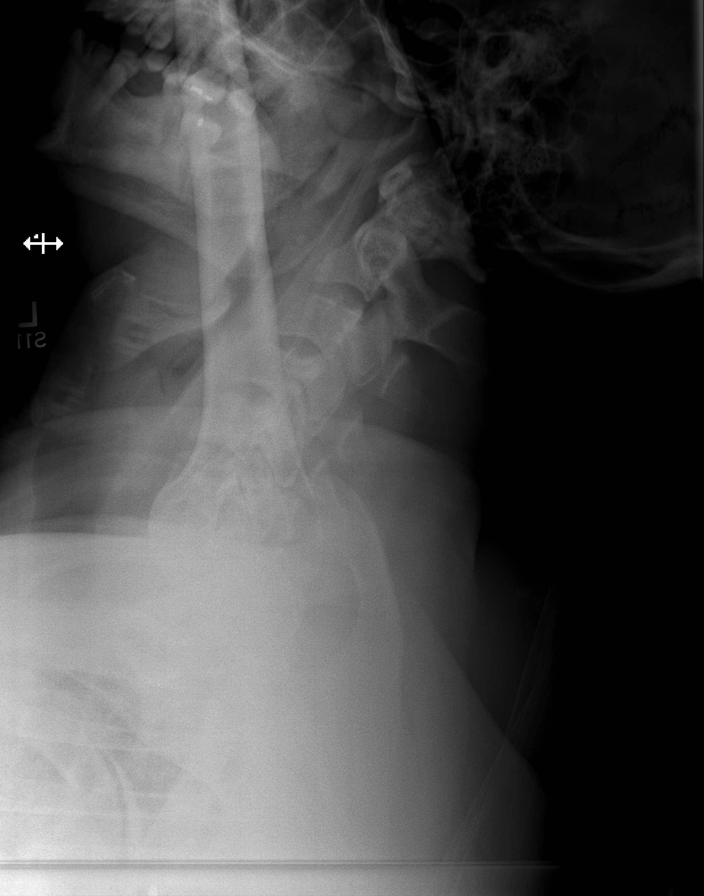

[w cervical swimmers (2 of 2)]
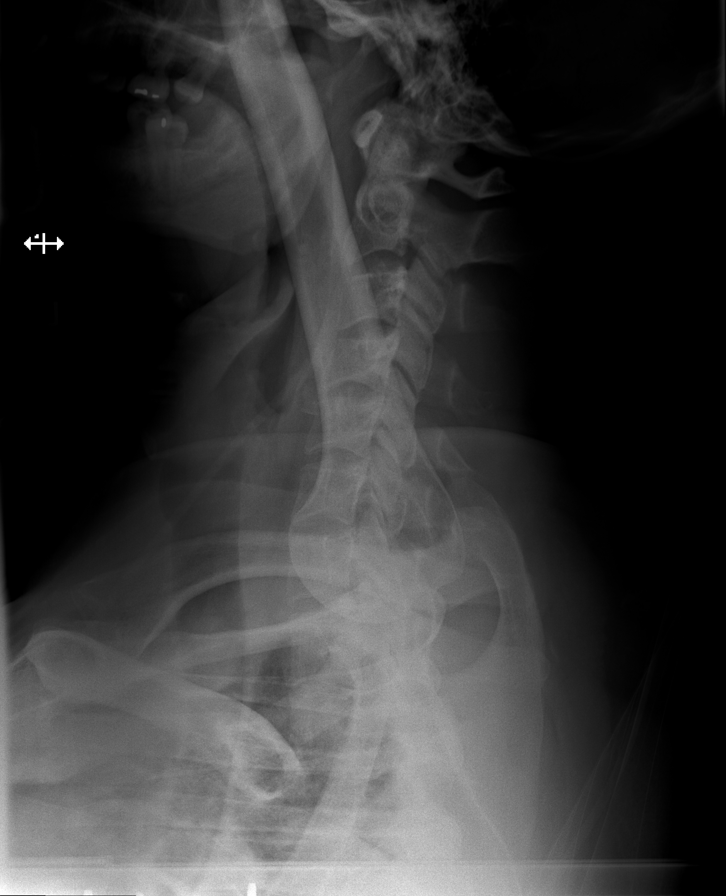

[8 of 8 positions shown; findings below may reference images not displayed]

FINDINGS: The prevertebral soft tissues are within normal limits. There
straightening and some reversal of the normal cervical lordosis.
Cervical spine is visualized from skull base through C6-7 on the
lateral view. C7-T1 is seen on the swimmer's view and is in normal
alignment.

Mild uncovertebral spurring is evident at C4-5 and C5-6, right
greater than left. Mild osseous foraminal narrowing is present on
the right at these 2 levels. The lung apices are clear.
IMPRESSION: 1. No acute fracture or traumatic subluxation.
2. Mild degenerative changes at C4-5 and C5-6 as detailed above.

## 2016-06-25 ENCOUNTER — Emergency Department (HOSPITAL_COMMUNITY)
Admission: EM | Admit: 2016-06-25 | Discharge: 2016-06-25 | Disposition: A | Discharge status: Home / Self Care | Payer: BLUE CROSS/BLUE SHIELD | Attending: Emergency Medicine | Admitting: Emergency Medicine

## 2016-06-25 DIAGNOSIS — K0889 Other specified disorders of teeth and supporting structures: Secondary | ICD-10-CM | POA: Insufficient documentation

## 2016-06-25 DIAGNOSIS — F17200 Nicotine dependence, unspecified, uncomplicated: Secondary | ICD-10-CM | POA: Insufficient documentation

## 2016-06-25 MED ORDER — HYDROCODONE-ACETAMINOPHEN 5-325 MG PO TABS: 1.0000 | ORAL_TABLET | ORAL | 0 refills | Status: DC | PRN

## 2016-06-25 NOTE — Discharge Instructions (Signed)
Follow-up with your dentist next week, as scheduled. Use heat on the sore area 3 or 4 times a day.

## 2016-06-25 NOTE — ED Triage Notes (Signed)
Pt reports left lower dental pain.  Pt seen at urgent care a few days ago and was prescribed penicillin and tramadol.  Pt reports that the medications have not helped.  Pt reports sweating.  Pt reports pain radiates down to her jaw.

## 2016-06-25 NOTE — ED Provider Notes (Signed)
[facility]-EMERGENCY DEPT Provider Note   CSN: [id number] Arrival date & time: 06/25/16  1759     History   Chief Complaint Chief Complaint  Patient presents with  . Dental Pain    HPI Paul Macdonald is a 36 y.o. male.  Patient states he has a painful tooth left lower, not improving with tramadol, but feeling like swelling less after taking penicillin, for 2 days.  He is also trying Motrin without relief.  He states that he has a follow-up appointment with his dentist, and believes it will be extracted, in 5 days.  He denies fever, chills, nausea, vomiting, chest pain or shortness of breath.  There are no other known modifying factors.  HPI  Past Medical History:  Diagnosis Date  . Depression     There are no active problems to display for this patient.   Past Surgical History:  Procedure Laterality Date  . 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 Authorizing Provider  ibuprofen (ADVIL,MOTRIN) 800 MG tablet Take 1 tablet (800 mg total) by mouth every 8 (eight) hours as needed. 02/18/16   Charlestine Night, PA-C  naproxen (NAPROSYN) 500 MG tablet Take 1 tablet (500 mg total) by mouth 2 (two) times daily. 02/15/16   Fayrene Helper, PA-C  penicillin v potassium (VEETID) 500 MG tablet Take 1 tablet (500 mg total) by mouth 3 (three) times daily. 02/15/16   Fayrene Helper, PA-C  traMADol (ULTRAM) 50 MG tablet Take 1 tablet (50 mg total) by mouth every 6 (six) hours as needed for severe pain. 02/18/16   Charlestine Night, PA-C    Family History No family history on file.  Social History Social History  Substance Use Topics  . Smoking status: Current Every Day Smoker    Packs/day: 1.00  . Smokeless tobacco: Never Used  . Alcohol use No     Allergies   Patient has no known allergies.   Review of Systems Review of Systems  All  other systems reviewed and are negative.    Physical Exam Updated Vital Signs BP (!) 143/89 (BP Location: Left Arm)   Pulse 91   Temp 98.2 F (36.8 C) (Oral)   Resp 18   Ht  (1.88 m)   Wt (!) 330 lb (149.7 kg)   SpO2 97%   BMI 42.37 kg/m   Physical Exam  Constitutional: He is oriented to person, place, and time. He appears well-developed and well-nourished. No distress.  HENT:  Head: Normocephalic and atraumatic.  Right Ear: External ear normal.  Left Ear: External ear normal.  Left lower molar, with filling, and visible cavity.  No surrounding inflammatory changes of gumline, or swelling of the periodontal tissue.  No trismus.  Eyes: Conjunctivae and EOM are normal. Pupils are equal, round, and reactive to light.  Neck: Normal range of motion and phonation normal. Neck supple.  Cardiovascular: Normal rate.   Pulmonary/Chest: Effort normal. He exhibits no bony tenderness.  Musculoskeletal: Normal range of motion.  Neurological: He is alert and oriented to person, place, and time. No cranial nerve deficit or sensory deficit. He exhibits normal muscle tone. Coordination normal.  Skin: Skin is warm, dry and intact.  Psychiatric: He has a normal mood and affect. His behavior is normal. Judgment and thought content normal.  Nursing  note and vitals reviewed.    ED Treatments / Results  Labs (all labs ordered are listed, but only abnormal results are displayed) Labs Reviewed - No data to display  EKG  EKG Interpretation None       Radiology No results found.  Procedures Procedures (including critical care time)  Medications Ordered in ED Medications - No data to display   Initial Impression / Assessment and Plan / ED Course  I have reviewed the triage vital signs and the nursing notes.  Pertinent labs & imaging results that were available during my care of the patient were reviewed by me and considered in my medical decision making (see chart for  details).     Medications - No data to display  Patient Vitals for the past 24 hrs:  BP Temp Temp src Pulse Resp SpO2 Height Weight  06/25/16 1813 (!) 143/89 98.2 F (36.8 C) Oral 91 18 97 %  (1.88 m) (!) 330 lb (149.7 kg)    6:57 PM Reevaluation with update and discussion. After initial assessment and treatment, an updated evaluation reveals no change in clinical status.  Findings discussed with the patient and all questions were answered. Crosley Stejskal L    Final Clinical Impressions(s) / ED Diagnoses   Final diagnoses:  Pain, dental    Dental pain, without visible abscess.  [facility] narcotic database reviewed.  He is not getting excessive prescriptions.  Nursing Notes Reviewed/ Care Coordinated Applicable Imaging Reviewed Interpretation of Laboratory Data incorporated into ED treatment  The patient appears reasonably screened and/or stabilized for discharge and I doubt any other medical condition or other [facility] requiring further screening, evaluation, or treatment in the ED at this time prior to discharge.  Plan: Home Medications-continue ibuprofen; Home Treatments-heat to affected area; return here if the recommended treatment, does not improve the symptoms; Recommended follow up-dental follow-up as planned   New Prescriptions New Prescriptions   No medications on file     Mancel Bale, MD 06/25/16 1858

## 2016-08-16 ENCOUNTER — Emergency Department (HOSPITAL_COMMUNITY)
Admission: EM | Admit: 2016-08-16 | Discharge: 2016-08-16 | Disposition: A | Discharge status: Home / Self Care | Payer: BLUE CROSS/BLUE SHIELD | Attending: Emergency Medicine | Admitting: Emergency Medicine

## 2016-08-16 ENCOUNTER — Encounter (HOSPITAL_COMMUNITY): Payer: Self-pay | Admitting: Emergency Medicine

## 2016-08-16 DIAGNOSIS — H9202 Otalgia, left ear: Secondary | ICD-10-CM | POA: Insufficient documentation

## 2016-08-16 DIAGNOSIS — Z720 Tobacco use: Secondary | ICD-10-CM

## 2016-08-16 DIAGNOSIS — K029 Dental caries, unspecified: Secondary | ICD-10-CM | POA: Insufficient documentation

## 2016-08-16 DIAGNOSIS — F17200 Nicotine dependence, unspecified, uncomplicated: Secondary | ICD-10-CM | POA: Insufficient documentation

## 2016-08-16 DIAGNOSIS — Z79899 Other long term (current) drug therapy: Secondary | ICD-10-CM | POA: Insufficient documentation

## 2016-08-16 DIAGNOSIS — K0381 Cracked tooth: Secondary | ICD-10-CM | POA: Insufficient documentation

## 2016-08-16 MED ORDER — KETOROLAC TROMETHAMINE 30 MG/ML IJ SOLN: 30.0000 mg | Freq: Once | INTRAMUSCULAR | Status: DC

## 2016-08-16 MED ORDER — PENICILLIN V POTASSIUM 500 MG PO TABS: 1000.0000 mg | ORAL_TABLET | Freq: Two times a day (BID) | ORAL | 0 refills | Status: DC

## 2016-08-16 NOTE — Discharge Instructions (Signed)
Apply warm compresses to jaw throughout the day. Take antibiotic until finished. Alternate between tylenol and motrin as needed for pain. Perform salt water swishes to help with pain/swelling. Use over the counter oragel as needed for additional relief. STOP SMOKING! Followup with a dentist is very important for ongoing evaluation and management of recurrent dental pain, call your oral surgeon to see if you can move your appointment up to sooner for ongoing management of your dental issues. Return to emergency department for emergent changing or worsening symptoms.

## 2016-08-16 NOTE — ED Provider Notes (Signed)
[facility] DEPT Provider Note   CSN: [id number] Arrival date & time: 08/16/16  1450     History   Chief Complaint Chief Complaint  Patient presents with  . Dental Pain    HPI Paul Macdonald is a 36 y.o. male with a PMHx of depression, dental decay, and psoriatic arthritis, who presents to the ED with complaints of left lower molar pain that started 2-3 hours ago when a portion of a tooth, that are he had issues with, broke off. He states that he is scheduled with an oral surgeon on 08/24/16 to remove the tooth due to caries, but it wasn't bothering him much until the tooth broke. He describes the pain is 8/10 constant sharp left lower molar pain that radiates in the left ear, worse with airflow and drinking, and with no treatments tried prior to arrival. He states that he takes ibuprofen daily for his psoriatic arthritis, took a dose of ibuprofen about 45 minutes before the tooth broke, however he did not take anything else after the tooth broke. He denies any gum swelling or drainage, trismus, drooling, ear drainage, fevers, chills, CP, SOB, abd pain, N/V/D/C, hematuria, dysuria, myalgias, arthralgias, numbness, tingling, focal weakness, or any other complaints at this time. +Smoker.   Of note, he has been seen several times in the ED over the last 6 months for dental pain; had 4 visits in 02/2016, and one visit 06/2016. NCDB review reveals he's had rx's for tramadol 50mg  #6 tabs on 06/23/16, vicodin 5-325mg  #20 tabs on 06/25/16, and tylenol 3 #10 tabs on 06/29/16 prescribed by a dentist (Dr. Marisue Humble).    The history is provided by the patient and medical records. No language interpreter was used.  Dental Pain   This is a recurrent problem. The current episode started 1 to 2 hours ago. The problem occurs constantly. The problem has not changed since onset.The pain is at a severity of 8/10. The pain is moderate. He has tried nothing for the symptoms. The treatment provided no relief.     Past Medical History:  Diagnosis Date  . Depression     There are no active problems to display for this patient.   Past Surgical History:  Procedure Laterality Date  . 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 Authorizing Provider  HYDROcodone-acetaminophen (NORCO) 5-325 MG tablet Take 1-2 tablets by mouth every 4 (four) hours as needed. 06/25/16   Mancel Bale, MD  ibuprofen (ADVIL,MOTRIN) 800 MG tablet Take 1 tablet (800 mg total) by mouth every 8 (eight) hours as needed. 02/18/16   Lawyer, Cristal Deer, PA-C  naproxen (NAPROSYN) 500 MG tablet Take 1 tablet (500 mg total) by mouth 2 (two) times daily. 02/15/16   Fayrene Helper, PA-C  penicillin v potassium (VEETID) 500 MG tablet Take 1 tablet (500 mg total) by mouth 3 (three) times daily. 02/15/16   Fayrene Helper, PA-C  traMADol (ULTRAM) 50 MG tablet Take 1 tablet (50 mg total) by mouth every 6 (six) hours as needed for severe pain. 02/18/16   Charlestine Night, PA-C    Family History No family history on file.  Social History Social History  Substance Use Topics  . Smoking status: Current Every Day Smoker    Packs/day: 1.00  . Smokeless tobacco: Never Used  . Alcohol  use No     Allergies   Patient has no known allergies.   Review of Systems Review of Systems  Constitutional: Negative for chills and fever.  HENT: Positive for dental problem and ear pain. Negative for drooling, ear discharge, facial swelling and trouble swallowing.   Respiratory: Negative for shortness of breath.   Cardiovascular: Negative for chest pain.  Gastrointestinal: Negative for abdominal pain, constipation, diarrhea, nausea and vomiting.  Genitourinary: Negative for dysuria and hematuria.  Musculoskeletal: Negative for arthralgias and myalgias.  Skin: Negative for color change.   Allergic/Immunologic: Negative for immunocompromised state.  Neurological: Negative for weakness and numbness.  Psychiatric/Behavioral: Negative for confusion.   All other systems reviewed and are negative for acute change except as noted in the HPI.    Physical Exam Updated Vital Signs BP (!) 142/110 (BP Location: Right Arm)   Pulse 100   Temp 98.2 F (36.8 C) (Oral)   Resp 20   SpO2 98%   Physical Exam  Constitutional: He is oriented to person, place, and time. Vital signs are normal. He appears well-developed and well-nourished.  Non-toxic appearance. No distress.  Afebrile, nontoxic, NAD  HENT:  Head: Normocephalic and atraumatic.  Right Ear: Hearing, tympanic membrane, external ear and ear canal normal.  Left Ear: Hearing, tympanic membrane, external ear and ear canal normal.  Nose: Nose normal.  Mouth/Throat: Uvula is midline, oropharynx is clear and moist and mucous membranes are normal. No trismus in the jaw. Dental caries present. No dental abscesses or uvula swelling. Tonsils are 0 on the right. Tonsils are 0 on the left. No tonsillar exudate.  Ears are clear bilaterally. Nose clear. Poor dentitia throughout, multiple missing teeth; L lower molar with posterior lateral portion broken off, down to the gumline, no obvious exposed nerve but difficult to see fully; obvious caries and decay, no surrounding erythema or swelling, no evidence of ludwig's or abscess. Oropharynx clear and moist, without uvular swelling or deviation, no trismus or drooling, no tonsillar swelling or erythema, no exudates.    Eyes: Conjunctivae and EOM are normal. Right eye exhibits no discharge. Left eye exhibits no discharge.  Neck: Normal range of motion. Neck supple.  Cardiovascular: Normal rate and intact distal pulses.   Pulmonary/Chest: Effort normal. No respiratory distress.  Abdominal: Normal appearance. He exhibits no distension.  Musculoskeletal: Normal range of motion.  Neurological: He is  alert and oriented to person, place, and time. He has normal strength. No sensory deficit.  Skin: Skin is warm, dry and intact. No rash noted.  Psychiatric: He has a normal mood and affect.  Nursing note and vitals reviewed.    ED Treatments / Results  Labs (all labs ordered are listed, but only abnormal results are displayed) Labs Reviewed - No data to display  EKG  EKG Interpretation None       Radiology No results found.  Procedures Procedures (including critical care time)  Medications Ordered in ED Medications  ketorolac (TORADOL) 30 MG/ML injection 30 mg (not administered)     Initial Impression / Assessment and Plan / ED Course  I have reviewed the triage vital signs and the nursing notes.  Pertinent labs & imaging results that were available during my care of the patient were reviewed by me and considered in my medical decision making (see chart for details).     36 y.o. male here with Dental pain associated with portion of molar breaking off just PTA, scheduled with oral surgeon in 8 days for removal  of this tooth, already had issues with the same tooth; no evidence of abscess, with patient afebrile, non toxic appearing and swallowing secretions well, no evidence of ludwig's. I advised that he should move his appt up to sooner if possible, and stressed the importance of dental follow up for ultimate management of dental pain.  I have also discussed reasons to return immediately to the ER.  Patient expresses understanding and agrees with plan.  I will also give PCN VK to cover for infection preemptively since he's going to have the tooth removed soon; discussed OTC remedies for pain control. Smoking cessation strongly advised. Will give toradol IM here for pain control.    Final Clinical Impressions(s) / ED Diagnoses   Final diagnoses:  Broken or cracked tooth, nontraumatic  Pain due to dental caries  Tobacco user    New Prescriptions New Prescriptions    PENICILLIN V POTASSIUM (VEETID) 500 MG TABLET    Take 2 tablets (1,000 mg total) by mouth 2 (two) times daily. X 7 days     [address], [place], [place] 08/16/16 1609  ADDENDUM 4:51 PM: Micah FlesherWent to inquire with nursing as to why his toradol still hadn't been given, and nursing informed me that he has not been in his room, and apparently eloped. Will print his papers with his abx rx in case he decides to return. Given this new information, it's possible the pt was drug seeking, however during the evaluation he didn't ask for narcotics; he asked for something "stronger" but I informed him of the policies regarding narcotics, and he agreed and said "I'm not looking for narcotics".     [address], [place], [place] 08/16/16 1653    Tegeler, Canary Brimhristopher J, MD 08/27/16 [phone]

## 2016-08-16 NOTE — ED Triage Notes (Signed)
Pt from home with complaints of lower right jaw pain. Pt states he had surgery for the tooth to be surgically removed and today the tooth broke and he now has 8/10 pain.

## 2017-08-03 ENCOUNTER — Encounter (HOSPITAL_COMMUNITY): Payer: Self-pay

## 2017-08-03 ENCOUNTER — Emergency Department (HOSPITAL_COMMUNITY)
Admission: EM | Admit: 2017-08-03 | Discharge: 2017-08-04 | Discharge status: Against Medical Advice | Payer: BLUE CROSS/BLUE SHIELD | Attending: Emergency Medicine | Admitting: Emergency Medicine

## 2017-08-03 DIAGNOSIS — Z5321 Procedure and treatment not carried out due to patient leaving prior to being seen by health care provider: Secondary | ICD-10-CM | POA: Insufficient documentation

## 2017-08-04 ENCOUNTER — Encounter (HOSPITAL_COMMUNITY): Payer: Self-pay | Admitting: Emergency Medicine

## 2017-08-04 ENCOUNTER — Emergency Department (HOSPITAL_COMMUNITY)
Admission: EM | Admit: 2017-08-04 | Discharge: 2017-08-04 | Disposition: A | Discharge status: Home / Self Care | Payer: BLUE CROSS/BLUE SHIELD | Attending: Emergency Medicine | Admitting: Emergency Medicine

## 2017-08-04 ENCOUNTER — Other Ambulatory Visit: Payer: Self-pay

## 2017-08-04 DIAGNOSIS — F17210 Nicotine dependence, cigarettes, uncomplicated: Secondary | ICD-10-CM | POA: Insufficient documentation

## 2017-08-04 DIAGNOSIS — M79604 Pain in right leg: Secondary | ICD-10-CM | POA: Insufficient documentation

## 2017-08-04 DIAGNOSIS — L550 Sunburn of first degree: Secondary | ICD-10-CM

## 2017-08-04 MED ORDER — HYDROCODONE-ACETAMINOPHEN 5-325 MG PO TABS: 1.0000 | ORAL_TABLET | ORAL | 0 refills | Status: DC | PRN

## 2017-08-04 MED ORDER — PREDNISONE 50 MG PO TABS: 50.0000 mg | ORAL_TABLET | Freq: Every day | ORAL | 0 refills | Status: DC

## 2017-08-04 MED ORDER — HYDROCODONE-ACETAMINOPHEN 5-325 MG PO TABS
1.0000 | ORAL_TABLET | Freq: Once | ORAL | Status: AC
  Administered 2017-08-04: 1 via ORAL
  Filled 2017-08-04: qty 1

## 2017-08-04 MED ORDER — PREDNISONE 10 MG PO TABS
60.0000 mg | ORAL_TABLET | Freq: Once | ORAL | Status: AC
  Administered 2017-08-04: 60 mg via ORAL
  Filled 2017-08-04: qty 1

## 2017-08-04 NOTE — ED Triage Notes (Signed)
Pt c/o sunburn to bilateral legs since Sunday.

## 2017-08-04 NOTE — ED Notes (Signed)
Pt says he went to [facility] ED 2 days ago and was given Hydroco/APAP- says he took one Monday and then "got rid of them" because he "thought he wouldn't need them".

## 2017-08-04 NOTE — Discharge Instructions (Addendum)
Take ibuprofen or naproxen as needed for less severe pain.  Take loratadine or cetirizine as needed for itching.  Make sure to apply sunscreen liberally and often when outside.

## 2017-08-04 NOTE — ED Provider Notes (Signed)
[facility] EMERGENCY DEPARTMENT Provider Note   CSN: [id number] Arrival date & time: 08/04/17  0249     History   Chief Complaint Chief Complaint  Patient presents with  . Sunburn    HPI Paul Macdonald is a 37 y.o. male.  The history is provided by the patient.  He has history of psoriatic arthritis and comes in with severe pain in his lower legs following sunburn 3 days ago.  He had gone to an emergency department in [place] and was given prescription for small number of hydrocodone-acetaminophen.  He thought that he was doing well enough to that he would not need the medication and he threw it away.  However, pain has gotten much worse.  He rates pain at 10/10.  He denies any itching.  He states that he had gone tubing during the day 3 days ago, and had applied sunscreen at the start of the day, but was in the water for 6 hours.  Past Medical History:  Diagnosis Date  . Depression     Patient Active Problem List   Diagnosis Date Noted  . Increased glucose level 01/24/2016  . Opioid dependence in remission (HCC) 01/23/2016  . Panic attacks 01/23/2016  . Psoriasis 01/23/2016  . Syncope 01/23/2016    Past Surgical History:  Procedure Laterality Date  . 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 Authorizing Provider  HYDROcodone-acetaminophen (NORCO/VICODIN) 5-325 MG tablet Take by mouth. 08/02/17 08/07/17 Yes [provider]  HYDROcodone-acetaminophen (NORCO) 5-325 MG tablet Take 1-2 tablets by mouth every 4 (four) hours as needed. 06/25/16   Mancel Bale, MD  ibuprofen (ADVIL,MOTRIN) 200 MG tablet Take by mouth.    [provider]  ibuprofen (ADVIL,MOTRIN) 800 MG tablet Take 1 tablet (800 mg total) by mouth every 8 (eight) hours as needed. 02/18/16   Lawyer, Cristal Deer, PA-C  naproxen (NAPROSYN)  500 MG tablet Take 1 tablet (500 mg total) by mouth 2 (two) times daily. 02/15/16   Fayrene Helper, PA-C  penicillin v potassium (VEETID) 500 MG tablet Take 1 tablet (500 mg total) by mouth 3 (three) times daily. 02/15/16   Fayrene Helper, PA-C  penicillin v potassium (VEETID) 500 MG tablet Take 2 tablets (1,000 mg total) by mouth 2 (two) times daily. X 7 days 08/16/16   Street, [place], PA-C  traMADol (ULTRAM) 50 MG tablet Take 1 tablet (50 mg total) by mouth every 6 (six) hours as needed for severe pain. 02/18/16   Charlestine Night, PA-C    Family History No family history on file.  Social History Social History   Tobacco Use  . Smoking status: Current Every Day Smoker    Packs/day: 1.00  . Smokeless tobacco: Never Used  Substance Use Topics  . Alcohol use: No  . Drug use: No    Comment: opioid and heroin use in past- per recent [facility] hospital records     Allergies   Patient has no known allergies.   Review of Systems Review of Systems  All other systems reviewed and are negative.    Physical Exam Updated Vital Signs BP (!) 142/96   Pulse 95   Temp 98.3 F (36.8 C)   Resp 18   Ht  (1.88 m)   Wt (!) 155.1 kg (342 lb)  SpO2 99%   BMI 43.91 kg/m   Physical Exam  Nursing note and vitals reviewed.  37 year old male, resting comfortably and in no acute distress. Vital signs are significant for mildly elevated systolic blood pressure. Oxygen saturation is 99%, which is normal. Head is normocephalic and atraumatic. PERRLA, EOMI. Oropharynx is clear. Neck is nontender and supple without adenopathy or JVD. Back is nontender and there is no CVA tenderness. Lungs are clear without rales, wheezes, or rhonchi. Chest is nontender. Heart has regular rate and rhythm without murmur. Abdomen is soft, flat, nontender without masses or hepatosplenomegaly and peristalsis is normoactive. Extremities have trace edema, full range of motion is present. Skin is warm and dry.  Lower  legs have intense erythema consistent with sunburn.  No blistering noted.  Also, plaques of psoriasis are noted on the trunk and upper extremity. Neurologic: Mental status is normal, cranial nerves are intact, there are no motor or sensory deficits.  ED Treatments / Results   Procedures Procedures (including critical care time)  Medications Ordered in ED Medications  predniSONE (DELTASONE) tablet 60 mg (has no administration in time range)  HYDROcodone-acetaminophen (NORCO/VICODIN) 5-325 MG per tablet 1 tablet (has no administration in time range)     Initial Impression / Assessment and Plan / ED Course  I have reviewed the triage vital signs and the nursing notes.  Severe sunburn with first-degree burns of the legs.  Old records are reviewed confirming ED visit at [facility] 2 days ago.  I reviewed his records on the [place] controlled substance reporting website, and he received a prescription for hydrocodone-acetaminophen 10 tablets, 2 days ago.  Last narcotic prescription prior to that was for 10 tablets of acetaminophen with codeine in April 2018.  He is discharged with prescription for prednisone for 5-day course, and given new prescription for 10 hydrocodone-acetaminophen 5-325 tablets.  Recommended over-the-counter antihistamines for when it starts itching.  Final Clinical Impressions(s) / ED Diagnoses   Final diagnoses:  Sunburn of first degree    ED Discharge Orders        Ordered    predniSONE (DELTASONE) 50 MG tablet  Daily     08/04/17 0611    HYDROcodone-acetaminophen (NORCO) 5-325 MG tablet  Every 4 hours PRN     08/04/17 1610       Dione Booze, MD 08/04/17 [phone]

## 2017-09-23 ENCOUNTER — Observation Stay (HOSPITAL_BASED_OUTPATIENT_CLINIC_OR_DEPARTMENT_OTHER): Payer: Self-pay

## 2017-09-23 ENCOUNTER — Observation Stay (HOSPITAL_COMMUNITY)
Admission: EM | Admit: 2017-09-23 | Discharge: 2017-09-24 | Disposition: A | Discharge status: Home / Self Care | Payer: Self-pay | Attending: Internal Medicine | Admitting: Internal Medicine

## 2017-09-23 ENCOUNTER — Other Ambulatory Visit: Payer: Self-pay

## 2017-09-23 ENCOUNTER — Ambulatory Visit (HOSPITAL_COMMUNITY): Payer: Self-pay

## 2017-09-23 ENCOUNTER — Emergency Department (HOSPITAL_COMMUNITY): Payer: Self-pay

## 2017-09-23 ENCOUNTER — Encounter (HOSPITAL_COMMUNITY): Payer: Self-pay

## 2017-09-23 DIAGNOSIS — Z6841 Body Mass Index (BMI) 40.0 and over, adult: Secondary | ICD-10-CM | POA: Insufficient documentation

## 2017-09-23 DIAGNOSIS — E1165 Type 2 diabetes mellitus with hyperglycemia: Secondary | ICD-10-CM | POA: Insufficient documentation

## 2017-09-23 DIAGNOSIS — F17210 Nicotine dependence, cigarettes, uncomplicated: Secondary | ICD-10-CM | POA: Insufficient documentation

## 2017-09-23 DIAGNOSIS — R7401 Elevation of levels of liver transaminase levels: Secondary | ICD-10-CM

## 2017-09-23 DIAGNOSIS — I10 Essential (primary) hypertension: Secondary | ICD-10-CM | POA: Insufficient documentation

## 2017-09-23 DIAGNOSIS — R070 Pain in throat: Secondary | ICD-10-CM

## 2017-09-23 DIAGNOSIS — Z720 Tobacco use: Secondary | ICD-10-CM

## 2017-09-23 DIAGNOSIS — Z8249 Family history of ischemic heart disease and other diseases of the circulatory system: Secondary | ICD-10-CM | POA: Insufficient documentation

## 2017-09-23 DIAGNOSIS — Z79899 Other long term (current) drug therapy: Secondary | ICD-10-CM | POA: Insufficient documentation

## 2017-09-23 DIAGNOSIS — I361 Nonrheumatic tricuspid (valve) insufficiency: Secondary | ICD-10-CM

## 2017-09-23 DIAGNOSIS — R062 Wheezing: Secondary | ICD-10-CM

## 2017-09-23 DIAGNOSIS — R079 Chest pain, unspecified: Secondary | ICD-10-CM

## 2017-09-23 DIAGNOSIS — Z7982 Long term (current) use of aspirin: Secondary | ICD-10-CM | POA: Insufficient documentation

## 2017-09-23 DIAGNOSIS — D72829 Elevated white blood cell count, unspecified: Secondary | ICD-10-CM

## 2017-09-23 DIAGNOSIS — R739 Hyperglycemia, unspecified: Secondary | ICD-10-CM

## 2017-09-23 DIAGNOSIS — R740 Nonspecific elevation of levels of transaminase and lactic acid dehydrogenase [LDH]: Secondary | ICD-10-CM

## 2017-09-23 DIAGNOSIS — I088 Other rheumatic multiple valve diseases: Secondary | ICD-10-CM | POA: Insufficient documentation

## 2017-09-23 DIAGNOSIS — L409 Psoriasis, unspecified: Secondary | ICD-10-CM

## 2017-09-23 DIAGNOSIS — F329 Major depressive disorder, single episode, unspecified: Secondary | ICD-10-CM

## 2017-09-23 DIAGNOSIS — E66813 Obesity, class 3: Secondary | ICD-10-CM

## 2017-09-23 DIAGNOSIS — F419 Anxiety disorder, unspecified: Secondary | ICD-10-CM

## 2017-09-23 DIAGNOSIS — F32A Depression, unspecified: Secondary | ICD-10-CM

## 2017-09-23 DIAGNOSIS — F410 Panic disorder [episodic paroxysmal anxiety] without agoraphobia: Secondary | ICD-10-CM | POA: Diagnosis present

## 2017-09-23 LAB — CBC
HCT: 47.3 % (ref 39.0–52.0)
HEMATOCRIT: 46.4 % (ref 39.0–52.0)
HEMOGLOBIN: 15.9 g/dL (ref 13.0–17.0)
Hemoglobin: 15.8 g/dL (ref 13.0–17.0)
MCH: 31.6 pg (ref 26.0–34.0)
MCH: 31 pg (ref 26.0–34.0)
MCHC: 34.3 g/dL (ref 30.0–36.0)
MCHC: 33.4 g/dL (ref 30.0–36.0)
MCV: 92.2 fL (ref 78.0–100.0)
MCV: 92.7 fL (ref 78.0–100.0)
PLATELETS: 285 10*3/uL (ref 150–400)
Platelets: 280 10*3/uL (ref 150–400)
RBC: 5.03 MIL/uL (ref 4.22–5.81)
RBC: 5.1 MIL/uL (ref 4.22–5.81)
RDW: 12.6 % (ref 11.5–15.5)
RDW: 12.6 % (ref 11.5–15.5)
WBC: 10.7 10*3/uL — ABNORMAL HIGH (ref 4.0–10.5)
WBC: 11.8 10*3/uL — ABNORMAL HIGH (ref 4.0–10.5)

## 2017-09-23 LAB — TROPONIN I
Troponin I: <0.03 ng/mL (ref <0.03)
Troponin I: <0.03 ng/mL (ref <0.03)

## 2017-09-23 LAB — HEPATIC FUNCTION PANEL
ALT: 57 U/L — AB (ref 0–44)
AST: 30 U/L (ref 15–41)
Albumin: 3.7 g/dL (ref 3.5–5.0)
Alkaline Phosphatase: 60 U/L (ref 38–126)
BILIRUBIN TOTAL: 0.4 mg/dL (ref 0.3–1.2)
Total Protein: 6.6 g/dL (ref 6.5–8.1)

## 2017-09-23 LAB — BASIC METABOLIC PANEL
ANION GAP: 8 (ref 5–15)
BUN: 15 mg/dL (ref 6–20)
CALCIUM: 8.7 mg/dL — AB (ref 8.9–10.3)
CHLORIDE: 107 mmol/L (ref 98–111)
CO2: 25 mmol/L (ref 22–32)
Creatinine, Ser: 0.85 mg/dL (ref 0.61–1.24)
GFR calc non Af Amer: >60 mL/min (ref >60)
Glucose, Bld: 139 mg/dL — ABNORMAL HIGH (ref 70–99)
POTASSIUM: 3.6 mmol/L (ref 3.5–5.1)
Sodium: 140 mmol/L (ref 135–145)

## 2017-09-23 LAB — CREATININE, SERUM
CREATININE: 0.9 mg/dL (ref 0.61–1.24)
GFR calc Af Amer: >60 mL/min (ref >60)
GFR calc non Af Amer: >60 mL/min (ref >60)

## 2017-09-23 LAB — RAPID URINE DRUG SCREEN, HOSP PERFORMED
AMPHETAMINES: NOT DETECTED
BENZODIAZEPINES: NOT DETECTED
COCAINE: NOT DETECTED
OPIATES: NOT DETECTED
Tetrahydrocannabinol: NOT DETECTED

## 2017-09-23 LAB — PROTIME-INR
INR: 1.2
PROTHROMBIN TIME: 15.1 s (ref 11.4–15.2)

## 2017-09-23 LAB — ECHOCARDIOGRAM COMPLETE
HEIGHTINCHES: 74 in
Weight: 5280 oz

## 2017-09-23 LAB — D-DIMER, QUANTITATIVE: D-Dimer, Quant: 0.27 ug/mL-FEU (ref 0.00–0.50)

## 2017-09-23 LAB — GLUCOSE, CAPILLARY: Glucose-Capillary: 148 mg/dL — ABNORMAL HIGH (ref 70–99)

## 2017-09-23 LAB — CBG MONITORING, ED
GLUCOSE-CAPILLARY: 93 mg/dL (ref 70–99)
Glucose-Capillary: 99 mg/dL (ref 70–99)

## 2017-09-23 LAB — LIPASE, BLOOD: Lipase: 50 U/L (ref 11–51)

## 2017-09-23 LAB — TSH: TSH: 1.484 u[IU]/mL (ref 0.350–4.500)

## 2017-09-23 LAB — LIPID PANEL
CHOL/HDL RATIO: 3.6 ratio
Cholesterol: 113 mg/dL (ref 0–200)
HDL: 31 mg/dL — AB (ref >40)
LDL CALC: 58 mg/dL (ref 0–99)
Triglycerides: 120 mg/dL (ref <150)
VLDL: 24 mg/dL (ref 0–40)

## 2017-09-23 LAB — HIV ANTIBODY (ROUTINE TESTING W REFLEX): HIV Screen 4th Generation wRfx: NONREACTIVE

## 2017-09-23 LAB — I-STAT TROPONIN, ED
TROPONIN I, POC: 0 ng/mL (ref 0.00–0.08)
Troponin i, poc: 0 ng/mL (ref 0.00–0.08)

## 2017-09-23 LAB — MAGNESIUM: MAGNESIUM: 2 mg/dL (ref 1.7–2.4)

## 2017-09-23 LAB — HEMOGLOBIN A1C
Hgb A1c MFr Bld: 6.6 % — ABNORMAL HIGH (ref 4.8–5.6)
Mean Plasma Glucose: 142.72 mg/dL

## 2017-09-23 LAB — PHOSPHORUS: Phosphorus: 4 mg/dL (ref 2.5–4.6)

## 2017-09-23 MED ORDER — GI COCKTAIL ~~LOC~~: 30.0000 mL | Freq: Four times a day (QID) | ORAL | Status: DC | PRN

## 2017-09-23 MED ORDER — IPRATROPIUM-ALBUTEROL 0.5-2.5 (3) MG/3ML IN SOLN
3.0000 mL | Freq: Three times a day (TID) | RESPIRATORY_TRACT | Status: DC
  Administered 2017-09-24: 3 mL via RESPIRATORY_TRACT

## 2017-09-23 MED ORDER — ASPIRIN EC 81 MG PO TBEC: 81.0000 mg | DELAYED_RELEASE_TABLET | Freq: Every day | ORAL | Status: DC

## 2017-09-23 MED ORDER — IOPAMIDOL (ISOVUE-370) INJECTION 76%
100.0000 mL | Freq: Once | INTRAVENOUS | Status: AC | PRN
  Administered 2017-09-23: 100 mL via INTRAVENOUS

## 2017-09-23 MED ORDER — MORPHINE SULFATE (PF) 2 MG/ML IV SOLN: 2.0000 mg | INTRAVENOUS | Status: DC | PRN

## 2017-09-23 MED ORDER — ASPIRIN EC 81 MG PO TBEC
81.0000 mg | DELAYED_RELEASE_TABLET | Freq: Every day | ORAL | Status: DC
  Administered 2017-09-24: 81 mg via ORAL
  Filled 2017-09-23: qty 1

## 2017-09-23 MED ORDER — ALPRAZOLAM 0.25 MG PO TABS: 0.2500 mg | ORAL_TABLET | Freq: Two times a day (BID) | ORAL | Status: DC | PRN

## 2017-09-23 MED ORDER — ASPIRIN 81 MG PO CHEW
324.0000 mg | CHEWABLE_TABLET | Freq: Once | ORAL | Status: AC
  Administered 2017-09-23: 324 mg via ORAL
  Filled 2017-09-23: qty 4

## 2017-09-23 MED ORDER — APREMILAST 30 MG PO TABS: 30.0000 mg | ORAL_TABLET | Freq: Two times a day (BID) | ORAL | Status: DC

## 2017-09-23 MED ORDER — HEPARIN SODIUM (PORCINE) 5000 UNIT/ML IJ SOLN
5000.0000 [IU] | Freq: Three times a day (TID) | INTRAMUSCULAR | Status: DC
  Administered 2017-09-23 – 2017-09-24 (×3): 5000 [IU] via SUBCUTANEOUS
  Filled 2017-09-23 (×4): qty 1

## 2017-09-23 MED ORDER — METOPROLOL TARTRATE 25 MG PO TABS
50.0000 mg | ORAL_TABLET | Freq: Once | ORAL | Status: AC
Start: 2017-09-23 — End: 2017-09-23
  Administered 2017-09-23: 50 mg via ORAL
  Filled 2017-09-23: qty 2

## 2017-09-23 MED ORDER — INSULIN ASPART 100 UNIT/ML ~~LOC~~ SOLN
0.0000 [IU] | Freq: Three times a day (TID) | SUBCUTANEOUS | Status: DC
  Administered 2017-09-24: 1 [IU] via SUBCUTANEOUS

## 2017-09-23 MED ORDER — IPRATROPIUM-ALBUTEROL 0.5-2.5 (3) MG/3ML IN SOLN
3.0000 mL | Freq: Four times a day (QID) | RESPIRATORY_TRACT | Status: DC
  Administered 2017-09-23: 3 mL via RESPIRATORY_TRACT
  Filled 2017-09-23 (×2): qty 3

## 2017-09-23 MED ORDER — ACETAMINOPHEN 325 MG PO TABS: 650.0000 mg | ORAL_TABLET | ORAL | Status: DC | PRN

## 2017-09-23 MED ORDER — NITROGLYCERIN 0.4 MG SL SUBL
0.4000 mg | SUBLINGUAL_TABLET | SUBLINGUAL | Status: DC | PRN
  Administered 2017-09-23: 0.4 mg via SUBLINGUAL
  Filled 2017-09-23: qty 1

## 2017-09-23 MED ORDER — LIVING WELL WITH DIABETES BOOK
Freq: Once | Status: AC
  Administered 2017-09-23: 15:00:00
  Filled 2017-09-23: qty 1

## 2017-09-23 MED ORDER — ONDANSETRON HCL 4 MG/2ML IJ SOLN: 4.0000 mg | Freq: Four times a day (QID) | INTRAMUSCULAR | Status: DC | PRN

## 2017-09-23 MED ORDER — HYDROXYZINE HCL 10 MG PO TABS: 10.0000 mg | ORAL_TABLET | Freq: Every day | ORAL | Status: DC | PRN

## 2017-09-23 NOTE — ED Notes (Addendum)
[facility]

## 2017-09-23 NOTE — H&P (Signed)
History and Physical    Paul Macdonald XWR:[id number] DOB: [birth year] DOA: 09/23/2017  PCP: Patient, No Pcp Per   Patient coming from: Home  Chief Complaint: Chest Pain  HPI: Paul Macdonald is a 37 y.o. male with medical history significant of psoriasis, anxiety and depression, intermittent hypertension on any medications, tobacco abuse, morbid obesity, and other comorbidities who presented to [facility] emergency room with a chief complaint of chest pain.  Patient states it was a sharp chest pain 7 out of 10 in severity that woke him up from his sleep with associated symptoms of diaphoresis, nausea, and radiation with the pain and numbness in his left hand.  Patient denies any lightheadedness or dizziness.  No GU or GI complaints and no burning or discomfort in urine.  Patient is an active tobacco smoker and smokes 1 pack/day and does have a family history of coronary artery disease were patient's father had an MI at age 22.  The emergency room and given sublingual nitroglycerin which alleviated the pain and reduced it from a 7 out of 10 in severity to 4 out of 10 in severity.  Patient states the chest pain was not as bad and the numbness and tingling was not as severe now his left hand.  Patient's wife at bedside states that he is very diaphoretic and sweaty and because of concern of the chest pain he was brought into the emergency room for evaluation.  [facility] was called to admit this patient for chest pain rule out ACS.  ED Course: Was given aspirin 324 mg, had basic blood work done including troponins, and given sublingual nitroglycerin.  Patient also had a chest x-ray done and EKG.  Review of Systems: As per HPI otherwise 10 point review of systems negative.    Past Medical History:  Diagnosis Date  . Depression    Past Surgical History:  Procedure Laterality Date  . 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  He has been smoking about 1.00 pack per day. He has never used smokeless tobacco. He reports that he does not drink alcohol or use drugs.  ALLERGIES No Known Allergies  FAMILY HISTORY -Patient's Mother has "Heart Problems" and Diabetes -Patient's Father had an MI at the age of 62  Prior to Admission medications   Medication Sig Start Date End Date Taking? Authorizing Provider  Apremilast (OTEZLA) 30 MG TABS Take 30 mg by mouth 2 (two) times daily.   Yes [provider]  hydrOXYzine (ATARAX/VISTARIL) 10 MG tablet Take 10 mg by mouth daily as needed for itching.   Yes [provider]  HYDROcodone-acetaminophen (NORCO) 5-325 MG tablet Take 1-2 tablets by mouth every 4 (four) hours as needed. Patient not taking: Reported on 09/23/2017 06/25/16   Mancel Bale, MD  HYDROcodone-acetaminophen [facility]) 5-325 MG tablet Take 1 tablet by mouth every 4 (four) hours as needed for moderate pain. Patient not taking: Reported on 09/23/2017 08/04/17   Dione Booze, MD  ibuprofen (ADVIL,MOTRIN) 800 MG tablet Take 1 tablet (800 mg total) by mouth every 8 (eight) hours as needed. Patient not taking: Reported on 09/23/2017 02/18/16   Charlestine Night, PA-C  predniSONE (DELTASONE) 50 MG tablet Take 1 tablet (50 mg total) by mouth daily. Patient not taking: Reported on 09/23/2017 08/04/17   Dione Booze, MD   Physical Exam: Vitals:   09/23/17 0330  09/23/17 0345 09/23/17 0502 09/23/17 0740  BP: 138/81 131/86 116/85 119/79  Pulse:   72 65  Resp: (!) 21 20 18 18   Temp:      TempSrc:      SpO2: 94% 90% 95% 98%  Weight:      Height:       Constitutional: WN/WD morbidly obese Caucasian Male in NAD and appears calm but uncomfortable Eyes: Lids and conjunctivae normal, sclerae anicteric  ENMT: External Ears, Nose appear normal. Grossly normal hearing. Mucous membranes are moist. Neck: Appears normal, supple, no cervical masses, normal ROM, no appreciable thyromegaly, no  JVD Respiratory: Diminihsed to auscultation bilaterally with expiratory wheezing; No appreciable rales, rhonchi or crackles. Normal respiratory effort and patient is not tachypenic. No accessory muscle use.  Cardiovascular: RRR, no murmurs / rubs / gallops. S1 and S2 auscultated. No extremity edema.  Abdomen: Soft, non-tender, Distended due to body habitus. No masses palpated. No appreciable hepatosplenomegaly. Bowel sounds positive x4.  GU: Deferred. Musculoskeletal: No clubbing / cyanosis of digits/nails. No joint deformity upper and lower extremities. Skin: Has Psoriasis on extensor surfaces diffusely scattered. Skin is warm and dry.  Neurologic: CN 2-12 grossly intact with no focal deficits.  Romberg sign and cerebellar reflexes not assessed.  Psychiatric: Normal judgment and insight. Alert and oriented x 3. Normal mood and appropriate affect.   Labs on Admission: I have personally reviewed following labs and imaging studies  CBC: Recent Labs  Lab 09/23/17 0216  WBC 10.7*  HGB 15.9  HCT 46.4  MCV 92.2  PLT 280   Basic Metabolic Panel: Recent Labs  Lab 09/23/17 0216  NA 140  K 3.6  CL 107  CO2 25  GLUCOSE 139*  BUN 15  CREATININE 0.85  CALCIUM 8.7*   GFR: Estimated Creatinine Clearance: 187.4 mL/min (by C-G formula based on SCr of 0.85 mg/dL). Liver Function Tests: Recent Labs  Lab 09/23/17 0216  AST 30  ALT 57*  ALKPHOS 60  BILITOT 0.4  PROT 6.6  ALBUMIN 3.7   Recent Labs  Lab 09/23/17 0216  LIPASE 50   No results for input(s): AMMONIA in the last 168 hours. Coagulation Profile: Recent Labs  Lab 09/23/17 0216  INR 1.20   Cardiac Enzymes: Recent Labs  Lab 09/23/17 0216  TROPONINI <0.03   BNP (last 3 results) No results for input(s): PROBNP in the last 8760 hours. HbA1C: No results for input(s): HGBA1C in the last 72 hours. CBG: No results for input(s): GLUCAP in the last 168 hours. Lipid Profile: No results for input(s): CHOL, HDL, LDLCALC,  TRIG, CHOLHDL, LDLDIRECT in the last 72 hours. Thyroid Function Tests: No results for input(s): TSH, T4TOTAL, FREET4, T3FREE, THYROIDAB in the last 72 hours. Anemia Panel: No results for input(s): VITAMINB12, FOLATE, FERRITIN, TIBC, IRON, RETICCTPCT in the last 72 hours. Urine analysis:    Component Value Date/Time   COLORURINE YELLOW 11/20/2014 1040   APPEARANCEUR CLEAR 11/20/2014 1040   LABSPEC 1.030 11/20/2014 1040   PHURINE 6.5 11/20/2014 1040   GLUCOSEU NEGATIVE 11/20/2014 1040   HGBUR NEGATIVE 11/20/2014 1040   BILIRUBINUR NEGATIVE 11/20/2014 1040   KETONESUR NEGATIVE 11/20/2014 1040   PROTEINUR NEGATIVE 11/20/2014 1040   UROBILINOGEN 0.2 11/20/2014 1040   NITRITE NEGATIVE 11/20/2014 1040   LEUKOCYTESUR NEGATIVE 11/20/2014 1040   Sepsis Labs: !!!!!!!!!!!!!!!!!!!!!!!!!!!!!!!!!!!!!!!!!!!! @LABRCNTIP (procalcitonin:4,lacticidven:4) )No results found for this or any previous visit (from the past 240 hour(s)).   Radiological Exams on Admission: Dg Chest 2 View  Result Date: 09/23/2017 CLINICAL DATA:  37 year old male with chest pain. EXAM: CHEST - 2 VIEW COMPARISON:  Chest radiograph dated 07/29/2014 FINDINGS: The heart size and mediastinal contours are within normal limits. Both lungs are clear. The visualized skeletal structures are unremarkable. IMPRESSION: No active cardiopulmonary disease. Electronically Signed   By: Elgie Collard M.D.   On: 09/23/2017 03:50   EKG: Independently reviewed.  Showed a sinus rhythm with a rate of 78 with no appreciable ST elevation or depression on my interpretation.  Assessment/Plan Active Problems:   Panic attacks   Psoriasis   Chest pain   Anxiety and depression   Obesity, Class III, BMI 40-49.9 (morbid obesity) (HCC)   Leukocytosis   Elevated ALT measurement   Tobacco abuse   Wheezing   Hyperglycemia  Chest Pain r/o ACS -Place in observation telemetry -Chest pain order set utilized; chest pain improved with sublingual  nitroglycerin -Cycle cardiac troponin eyes x3; initial cardiac troponins have been negative -Check echocardiogram -Check lipid panel, hemoglobin A1c, magnesium, phosphorus, and TSH -Patient has several risk factors including morbid obesity, family history of early MI, and tobacco abuse along with suspected hypertension (is that he has intermittently and is not medicated) -Repeat EKG -Check UDS -Given ASA 324 mg p.o. once early this morning; start ASA 81 mg p.o. daily 21 -Continue with Nitroglycerin 0.4 mill grams sublingual every 5 minutes as needed for chest pain -Continue with IV Morphine 2 mg every 2 hours PRN for chest pain -Continue with Ondansetron 4 mg IV every 6 as needed for nausea -Consult Cardiology for further evaluation recommendations  Depression and Anxiety -Per patient not taking SSRIs anymore -Continue Hydroxyzine 10 mg p.o. daily PRN for itching and anxiety -Also start alprazolam 0.25 mg twice daily as needed for anxiety  Psoriasis -Continue with apremilast 30 mg p.o. twice daily -Patient no longer taking prednisone -Continue to monitor  Morbid Obesity -BMI on admission 42.37 kg/m -Weight loss counseling given  Leukocytosis -Was elevated at 10.7 -Likely reactive -Continue to monitor for signs and symptoms of infection -Repeat CBC in a.m.  Elevated ALT -? Related to possible ACS -ALT on admission was 57 and AST is within normal limits -Continue to monitor and trend and if still elevated in a.m. we will obtain a right upper quadrant ultrasound as well as a hepatitis panel -Repeat CMP in the a.m. and follow LFTs  Tobacco Abuse -Smoking cessation counseling given -We will give nicotine patch if patient rules out for ACS  Wheezing -Patient found to be wheezing on examination likely secondary to tobacco abuse -Started patient on duo nebs 3 mL's nebulizer every 6  Hyperglycemia -Patient blood glucose on admission was 139 -Check Hemoglobin A1c -If blood  sugars consistently remain elevated will place on sensitive NovoLog sliding scale  DVT prophylaxis: Heparin 5,000 units sq q8h Code Status: FULL CODE Family Communication: Discussed with wife at bedside Disposition Plan: Anticipate D/C back to Home environment when medically stable Consults called: [facility]  Admission status: Obs Telemetry  Severity of Illness: The appropriate patient status for this patient is OBSERVATION. Observation status is judged to be reasonable and necessary in order to provide the required intensity of service to ensure the patient's safety. The patient's presenting symptoms, physical exam findings, and initial radiographic and laboratory data in the context of their medical condition is felt to place them at decreased risk for further clinical deterioration. Furthermore, it is anticipated that the patient will be medically stable for discharge from the hospital within 2 midnights of admission. The following  factors support the patient status of observation.   " The patient's presenting symptoms include Chest pain, diaphoresis and Left Hand Numbness. " The physical exam findings include Wheezing bilaterally. " The initial radiographic and laboratory data are reassuring and Troponin Negative so far.   Merlene Laughtermair Latif Kamdyn Covel, D.O. [facility] Pager [phone]  If 7PM-7AM, please contact night-coverage [email or web address] Password [email or web address]  09/23/2017, 8:20 AM

## 2017-09-23 NOTE — ED Provider Notes (Signed)
[facility]-EMERGENCY DEPT Provider Note   CSN: [id number] Arrival date & time: 09/23/17  0132     History   Chief Complaint Chief Complaint  Patient presents with  . Chest Pain    HPI Paul Macdonald is a 37 y.o. male.  Patient with history of obesity, psoriasis presenting with left-sided arm pain that came on while he was lying in bed tonight around midnight.  Reports pain in his left arm from shoulder to his fingertips that extends to his neck and chest.  This pain is been constant for the past 4 hours.  Is associated with some nausea and diaphoresis.  He denies any shortness of breath, cough, nausea, vomiting.  There is no focal weakness, numbness or tingling. He describes arm pain as an ache" feels like it is his sleep".  There is no weakness in the arm.  There is no difficulty speaking or difficulty swallowing.  States a family member had a heart attack at age 74.  The history is provided by the patient.  Chest Pain   Associated symptoms include diaphoresis and nausea. Pertinent negatives include no abdominal pain, no dizziness, no headaches, no shortness of breath, no vomiting and no weakness.    Past Medical History:  Diagnosis Date  . Depression     Patient Active Problem List   Diagnosis Date Noted  . Increased glucose level 01/24/2016  . Opioid dependence in remission (HCC) 01/23/2016  . Panic attacks 01/23/2016  . Psoriasis 01/23/2016  . Syncope 01/23/2016    Past Surgical History:  Procedure Laterality Date  . 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 Authorizing Provider  Apremilast (OTEZLA) 30 MG TABS Take 30 mg by mouth 2 (two) times daily.   Yes [provider]  hydrOXYzine (ATARAX/VISTARIL) 10 MG tablet Take 10 mg by mouth daily as needed for itching.   Yes [provider]  HYDROcodone-acetaminophen (NORCO) 5-325 MG tablet Take 1-2 tablets by mouth every 4 (four) hours as needed. Patient not taking: Reported on 09/23/2017 06/25/16   Mancel Bale, MD  HYDROcodone-acetaminophen [facility]) 5-325 MG tablet Take 1 tablet by mouth every 4 (four) hours as needed for moderate pain. Patient not taking: Reported on 09/23/2017 08/04/17   Dione Booze, MD  ibuprofen (ADVIL,MOTRIN) 800 MG tablet Take 1 tablet (800 mg total) by mouth every 8 (eight) hours as needed. Patient not taking: Reported on 09/23/2017 02/18/16   Charlestine Night, PA-C  predniSONE (DELTASONE) 50 MG tablet Take 1 tablet (50 mg total) by mouth daily. Patient not taking: Reported on 09/23/2017 08/04/17   Dione Booze, MD    Family History History reviewed. No pertinent family history.  Social History Social History   Tobacco Use  . Smoking status: Current Every Day Smoker    Packs/day: 1.00  . Smokeless tobacco: Never Used  Substance Use Topics  . Alcohol use: No  . Drug use: No    Comment: opioid and heroin use in past- per recent [facility] hospital records     Allergies   Patient has no known allergies.   Review of Systems Review of Systems  Constitutional: Positive for diaphoresis. Negative for activity change and appetite change.  HENT: Negative for congestion.   Eyes: Negative for visual disturbance.  Respiratory: Positive for  chest tightness. Negative for shortness of breath.   Cardiovascular: Positive for chest pain.  Gastrointestinal: Positive for nausea. Negative for abdominal pain and vomiting.  Genitourinary: Negative for dysuria, hematuria and testicular pain.  Musculoskeletal: Negative for arthralgias and myalgias.  Neurological: Negative for dizziness, weakness and headaches.    all other systems are negative except as noted in the HPI and PMH.    Physical Exam Updated Vital Signs BP 140/86 (BP Location: Left Arm)   Pulse 94   Temp 98.6 F (37 C) (Oral)    Resp 18   Ht 6\' 2"  (1.88 m)   Wt (!) 149.7 kg (330 lb)   SpO2 97%   BMI 42.37 kg/m   Physical Exam  Constitutional: He is oriented to person, place, and time. He appears well-developed and well-nourished. No distress.  HENT:  Head: Normocephalic and atraumatic.  Mouth/Throat: Oropharynx is clear and moist. No oropharyngeal exudate.  Eyes: Pupils are equal, round, and reactive to light. Conjunctivae and EOM are normal.  Neck: Normal range of motion. Neck supple.  No meningismus.  Cardiovascular: Normal rate, regular rhythm, normal heart sounds and intact distal pulses.  No murmur heard. Intact radial pulse and grip strength.  No chest wall tenderness  Pulmonary/Chest: Effort normal and breath sounds normal. No respiratory distress. He exhibits no tenderness.  Abdominal: Soft. There is no tenderness. There is no rebound and no guarding.  Musculoskeletal: Normal range of motion. He exhibits no edema or tenderness.  Neurological: He is alert and oriented to person, place, and time. No cranial nerve deficit. He exhibits normal muscle tone. Coordination normal.  No ataxia on finger to nose bilaterally. No pronator drift. 5/5 strength throughout. CN 2-12 intact.Equal grip strength. Sensation intact.   Skin: Skin is warm. He is diaphoretic.  Psychiatric: He has a normal mood and affect. His behavior is normal.  Nursing note and vitals reviewed.    ED Treatments / Results  Labs (all labs ordered are listed, but only abnormal results are displayed) Labs Reviewed  BASIC METABOLIC PANEL - Abnormal; Notable for the following components:      Result Value   Glucose, Bld 139 (*)    Calcium 8.7 (*)    All other components within normal limits  CBC - Abnormal; Notable for the following components:   WBC 10.7 (*)    All other components within normal limits  TROPONIN I  PROTIME-INR  D-DIMER, QUANTITATIVE (NOT AT [facility])  I-STAT TROPONIN, ED    EKG EKG  Interpretation  Date/Time:  Thursday September 23 2017 01:40:37 EDT Ventricular Rate:  93 PR Interval:    QRS Duration: 93 QT Interval:  344 QTC Calculation: 428 R Axis:   54 Text Interpretation:  Sinus rhythm Probable left atrial enlargement Baseline wander in lead(s) V1 Rate faster Confirmed by Glynn Octave [phone]) on 09/23/2017 1:45:27 AM   Radiology Dg Chest 2 View  Result Date: 09/23/2017 CLINICAL DATA:  37 year old male with chest pain. EXAM: CHEST - 2 VIEW COMPARISON:  Chest radiograph dated 07/29/2014 FINDINGS: The heart size and mediastinal contours are within normal limits. Both lungs are clear. The visualized skeletal structures are unremarkable. IMPRESSION: No active cardiopulmonary disease. Electronically Signed   By: Elgie Collard M.D.   On: 09/23/2017 03:50    Procedures Procedures (including critical care time)  Medications Ordered in ED Medications  aspirin chewable tablet 324 mg (has no administration in time range)  nitroGLYCERIN (NITROSTAT) SL tablet 0.4 mg (has no administration in time range)  Initial Impression / Assessment and Plan / ED Course  I have reviewed the triage vital signs and the nursing notes.  Pertinent labs & imaging results that were available during my care of the patient were reviewed by me and considered in my medical decision making (see chart for details).    Patient with left-sided arm pain and chest pain at onset and resting about 4 hours ago has been constant.  Associated with nausea and diaphoresis.  No weakness in the arm.  EKG shows sinus rhythm without acute ST changes.  Minimal ST depression inferiorly appear stable. Aspirin and nitroglycerin given.  Heart score is 4.  Troponin is negative, d-dimer is negative.  Pain is improved with nitroglycerin.  Troponin negative x2.  Chest x-ray is negative.  Patient states his chest and arm pain have almost resolved.  Will give second nitroglycerin. Patient is concerned with his  family history Of possible heart disease.  He does not have a doctor to follow-up with. He has multiple cardiovascular risk factors including his history of smoking and obesity.  He is agreeable to observation admission for rule out MI. D/w Dr. Toniann FailKakrakandy. Final Clinical Impressions(s) / ED Diagnoses   Final diagnoses:  None    ED Discharge Orders    None       Raetta Agostinelli, Jeannett SeniorStephen, MD 09/23/17 [phone]

## 2017-09-23 NOTE — ED Triage Notes (Signed)
Pt presents to ED from home for chest pain and L arm numbness. Pt reports that the pain started suddenly around 0030. Pt also endorses nausea and sweating.

## 2017-09-23 NOTE — ED Notes (Signed)
Bed: WA26 Expected date:  Expected time:  Means of arrival:  Comments: 

## 2017-09-23 NOTE — Progress Notes (Signed)
Inpatient Diabetes Program Recommendations  AACE/ADA: New Consensus Statement on Inpatient Glycemic Control (2015)  Target Ranges:  Prepandial:   less than 140 mg/dL      Peak postprandial:   less than 180 mg/dL (1-2 hours)      Critically ill patients:  140 - 180 mg/dL   Lab Results  Component Value Date   GLUCAP 99 09/23/2017   HGBA1C 6.6 (H) 09/23/2017    Review of Glycemic Control  Diabetes history: None Outpatient Diabetes medications: None Current orders for Inpatient glycemic control: Novolog 0-9 units tidwc  HgbA1C - 6.6% - indicates diagnosis of DM.  Spoke with pt about new diagnosis and HgbA1C results. Pt states both sides of family have diabetes. Recently lost 25 pounds unintentionally. Pt states he does not have a PCP, but wife states [facility] and [facility] Dr. Delrae AlfredMulberry. Discussed importance of controlling blood sugars to prevent long-term complications. Long discussion regarding lifestyle changes to control his diabetes, diet, exercise and stress management. Pt states he will cut out the sodas and sweet energy drinks and make effort to modify serving sizes. Stressed importance of daily physical activity. Ordered Living Well with Diabetes book and encouraged pt to call and make appt with PCP within the week. Questions answered.  Thank you. Ailene Ardshonda Apostolos Blagg, RD, LDN, CDE Inpatient Diabetes Coordinator [phone]

## 2017-09-23 NOTE — Progress Notes (Addendum)
Reviewed CT scan Calcium score 0 Coronary arteries are normal    OK to D/C from cardiac standpoint      Counselled on tobacco cessation.  [facility] will sign off.   Medication Recommendations:  None Other recommendations (labs, testing, etc):  None Follow up as an outpatient:  With PCP  Dietrich PatesPaula Ross

## 2017-09-23 NOTE — Progress Notes (Signed)
[facility] has been [facility] in [facility].  [facility] was to call when they were ready for patient so that transportation could be arranged.  [facility] at approx 15:15 wanting to know reader of test and placement of 18 g IV.  ED charge nurse [facility] with [facility] about failure to place 18 g and delay in treatment since patient does not have 18 g.  Charge nurse advised patient has had 20 g since arrival, and that patient is usually transferred with 20 g in place.  18 g placed pursuant to this conversation.  Charge nurse was to call [facility] regarding additional instructions needed to facilitate transfer.  Updated admitted physician, Dr. Marland McalpineSheikh.

## 2017-09-23 NOTE — ED Notes (Signed)
[facility] left with patient 

## 2017-09-23 NOTE — Consult Note (Addendum)
Cardiology Consultation:   Patient ID: Paul Macdonald; [id number]; 17-May-1980   Admit date: 09/23/2017 Date of Consult: 09/23/2017  Primary Care Provider: Patient, No Pcp Per Primary Cardiologist: New to Dr. Tenny Craw  Patient Profile:   Paul Macdonald is a 37 y.o. male with a hx of psoriasis, anxiety, depression, subjective intermittent high blood pressure and prior history of polysubstance abuse (quit in 2015/2016) who is being seen today for the evaluation of chest pain at the request of Dr. Marland Mcalpine.   Prior history of heavy alcohol drinking and polysubstance abuse.  Quit 2015/2016.  He goes to scheduling session regularly.  Ongoing tobacco smoking of 1 pack a day for past 15 years.  Family history significant for CAD.  Father had MI at age 85, mother had MI in her early 33s and both grandparents also has history of CAD.  History of Present Illness:   Mr. Zaino was in usual state of health up until yesterday when he noted sudden left axillary/anterior sharp chest pain at 11 PM while watching movie.  Rate his pain 7 out of 10.  Described as sharp.  Radiating to his left arm, after 10 minutes he noted numbness and tingling on his left arm.  He was severely diaphoretic.  Noted intermittent nausea and shortness of breath when her significant other bringing to ER for further evaluation.  He got sublingual nitroglycerin x1 in ER with improvement of pain and eventually resolved in 30 minutes.  Total duration of pain 3 to 4 hours.  No recurrence.  No regular exercise.  Denies prior history of chest pain, shortness of breath, palpitation, abdominal pain, dizziness, orthopnea, PND, syncope, fever, chills or melena.  Admitted for rule out.  Point-of-care troponin negative x 2.  Troponin I negative x 2.  Electrolyte and serum creatinine normal.  Minimally elevated ALT 57.  Hemoglobin A1c 6.6.  Normal thyroid function test.  D-dimer negative.  Chest x-ray without acute cardiopulmonary  disease.  Past Medical History:  Diagnosis Date  . Depression     Past Surgical History:  Procedure Laterality Date  . 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 Apremilast  30 mg Oral BID  . [START ON 09/24/2017] aspirin EC  81 mg Oral Daily  . heparin  5,000 Units Subcutaneous Q8H  . ipratropium-albuterol  3 mL Nebulization Q6H   Continuous Infusions:  PRN Meds: nitroGLYCERIN  Allergies:   No Known Allergies  Social History:   Social History   Socioeconomic History  . Marital status: Single    Spouse name: Not on file  . Number of children: Not on file  . Years of education: Not on file  . Highest education level: Not on file  Occupational History  . Not on file  Social Needs  . Financial resource strain: Not on file  . Food insecurity:    Worry: Not on file    Inability: Not on file  . Transportation needs:    Medical: Not on file    Non-medical: Not on file  Tobacco Use  . Smoking status: Current Every Day Smoker    Packs/day: 1.00  . Smokeless tobacco: Never Used  Substance and Sexual Activity  . Alcohol use: No  . 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 hospital records  . Sexual activity: Not on file  Lifestyle  .  Physical activity:    Days per week: Not on file    Minutes per session: Not on file  . Stress: Not on file  Relationships  . Social connections:    Talks on phone: Not on file    Gets together: Not on file    Attends religious service: Not on file    Active member of club or organization: Not on file    Attends meetings of clubs or organizations: Not on file    Relationship status: Not on file  . Intimate partner violence:    Fear of current or ex partner: Not on file    Emotionally abused: Not on file    Physically abused: Not on file    Forced sexual activity: Not on file  Other Topics Concern  . Not on file   Social History Narrative  . Not on file    Family History:   As above  ROS:  Please see the history of present illness.  All other ROS reviewed and negative.     Physical Exam/Data:   Vitals:   09/23/17 0330 09/23/17 0345 09/23/17 0502 09/23/17 0740  BP: 138/81 131/86 116/85 119/79  Pulse:   72 65  Resp: (!) 21 20 18 18   Temp:      TempSrc:      SpO2: 94% 90% 95% 98%  Weight:      Height:       No intake or output data in the 24 hours ending 09/23/17 1000 Filed Weights   09/23/17 0146  Weight: (!) 330 lb (149.7 kg)   Body mass index is 42.37 kg/m.  General:  Well nourished, well developed, in no acute distress HEENT: normal Lymph: no adenopathy Neck: no JVD Endocrine:  No thryomegaly Vascular: No carotid bruits; FA pulses 2+ bilaterally without bruits  Cardiac:  normal S1, S2; RRR; no murmur.  Tender to palpation at left axillary area Lungs: Diffuse wheezing  abd: soft, nontender, no hepatomegaly  Ext: no edema Musculoskeletal:  No deformities, BUE and BLE strength normal and equal Skin: warm and dry  Neuro:  CNs 2-12 intact, no focal abnormalities noted Psych:  Normal affect   EKG:  The EKG was personally reviewed and demonstrates: Normal sinus rhythm at rate of 65 bpm Telemetry:  Telemetry was personally reviewed and demonstrates: Sinus rhythm at controlled ventricular rate  Relevant CV Studies: Echocardiogram pending this admission  Laboratory Data:  Chemistry Recent Labs  Lab 09/23/17 0216  NA 140  K 3.6  CL 107  CO2 25  GLUCOSE 139*  BUN 15  CREATININE 0.85  CALCIUM 8.7*  GFRNONAA >60  GFRAA >60  ANIONGAP 8    Recent Labs  Lab 09/23/17 0216  PROT 6.6  ALBUMIN 3.7  AST 30  ALT 57*  ALKPHOS 60  BILITOT 0.4   Hematology Recent Labs  Lab 09/23/17 0216  WBC 10.7*  RBC 5.03  HGB 15.9  HCT 46.4  MCV 92.2  MCH 31.6  MCHC 34.3  RDW 12.6  PLT 280   Cardiac Enzymes Recent Labs  Lab 09/23/17 0216 09/23/17 0820  TROPONINI  <0.03 <0.03    Recent Labs  Lab 09/23/17 0244 09/23/17 0603  TROPIPOC 0.00 0.00   DDimer  Recent Labs  Lab 09/23/17 0215  DDIMER 0.27    Radiology/Studies:  Dg Chest 2 View  Result Date: 09/23/2017 CLINICAL DATA:  37 year old male with chest pain. EXAM: CHEST - 2 VIEW COMPARISON:  Chest radiograph dated 07/29/2014 FINDINGS: The heart  size and mediastinal contours are within normal limits. Both lungs are clear. The visualized skeletal structures are unremarkable. IMPRESSION: No active cardiopulmonary disease. Electronically Signed   By: Elgie CollardArash  Radparvar M.D.   On: 09/23/2017 03:50    Assessment and Plan:   1. Chest pain Seems atypical.  Reproducible with palpation.  However, his pain eventually resolved after sublingual nitroglycerin and has significant cardiac risk factor including ongoing tobacco smoking, prior history of polysubstance abuse, morbid obesity, new diagnosis of diabetes (hemoglobin A1c 6.6)  and strong family history of early CAD. -Troponin negative so far.  EKG without acute ischemic changes. -He will benefit for ischemic evaluation at some point.  He already ate  Breakfast. Will plan coronary CT today. pending echocardiogram this admission. -Dr. Tenny Crawoss to see today.  2.  New diagnosis of diabetes -Hemoglobin A1c 6.6.  Per primary team.  3.  Tobacco abuse -Cessation advised.  Education given.  4.  Elevated ALT -Cycle troponin.  Plan per primary team  5.  Morbid obesity -Encouraged weight loss and daily exercise.   For questions or updates, please contact [facility] Please consult [email or web address] for contact info under Cardiology/STEMI.   Signed, Manson PasseyBhavinkumar Bhagat, PA  09/23/2017 10:00 AM   Pt seen and examined  I have reviewed findings with B Bhagat  Agree with above Pt is a 37 yo with no known CAD   Strong FHX of CAD   He has hsitory of tobacco abuse   He is not that acitve   Presents with CP and L arm discomfort  ON exam  Lungs aer CTA   Cardiac  exam:   RRR  NO S3 or murmurs    Chest IS NOT tender to palpation    Abd is benign  No hepaomegaly   Ext are with edema  Equal pulses  EKG is without acute findings   GIven FHx, and tobacco abuse I think pt should have CT scan to r/o significant CAD  Counselled on tobacco abuse   Counselled on wt, diet  Dietrich PatesPaula Javarious Elsayed MD \

## 2017-09-23 NOTE — Progress Notes (Signed)
  Echocardiogram 2D Echocardiogram has been performed.  Janalyn HarderWest, Doc Mandala R 09/23/2017, 2:45 PM

## 2017-09-23 NOTE — ED Notes (Signed)
Patient transported to X-ray 

## 2017-09-24 DIAGNOSIS — R071 Chest pain on breathing: Secondary | ICD-10-CM

## 2017-09-24 DIAGNOSIS — F410 Panic disorder [episodic paroxysmal anxiety] without agoraphobia: Secondary | ICD-10-CM

## 2017-09-24 LAB — COMPREHENSIVE METABOLIC PANEL
ALBUMIN: 3.4 g/dL — AB (ref 3.5–5.0)
ALK PHOS: 69 U/L (ref 38–126)
ALT: 52 U/L — AB (ref 0–44)
ANION GAP: 8 (ref 5–15)
AST: 26 U/L (ref 15–41)
BILIRUBIN TOTAL: 0.4 mg/dL (ref 0.3–1.2)
BUN: 14 mg/dL (ref 6–20)
CALCIUM: 8.5 mg/dL — AB (ref 8.9–10.3)
CO2: 27 mmol/L (ref 22–32)
CREATININE: 0.85 mg/dL (ref 0.61–1.24)
Chloride: 106 mmol/L (ref 98–111)
GFR calc Af Amer: >60 mL/min (ref >60)
GFR calc non Af Amer: >60 mL/min (ref >60)
GLUCOSE: 147 mg/dL — AB (ref 70–99)
Potassium: 4 mmol/L (ref 3.5–5.1)
SODIUM: 141 mmol/L (ref 135–145)
TOTAL PROTEIN: 6.3 g/dL — AB (ref 6.5–8.1)

## 2017-09-24 LAB — CBC WITH DIFFERENTIAL/PLATELET
BASOS PCT: 1 %
Basophils Absolute: 0.1 10*3/uL (ref 0.0–0.1)
EOS ABS: 0.4 10*3/uL (ref 0.0–0.7)
Eosinophils Relative: 5 %
HEMATOCRIT: 45.6 % (ref 39.0–52.0)
HEMOGLOBIN: 15.5 g/dL (ref 13.0–17.0)
LYMPHS ABS: 3.8 10*3/uL (ref 0.7–4.0)
Lymphocytes Relative: 41 %
MCH: 31.6 pg (ref 26.0–34.0)
MCHC: 34 g/dL (ref 30.0–36.0)
MCV: 93.1 fL (ref 78.0–100.0)
MONOS PCT: 8 %
Monocytes Absolute: 0.7 10*3/uL (ref 0.1–1.0)
NEUTROS ABS: 4.4 10*3/uL (ref 1.7–7.7)
Neutrophils Relative %: 47 %
Platelets: 268 10*3/uL (ref 150–400)
RBC: 4.9 MIL/uL (ref 4.22–5.81)
RDW: 12.7 % (ref 11.5–15.5)
WBC: 9.4 10*3/uL (ref 4.0–10.5)

## 2017-09-24 LAB — GLUCOSE, CAPILLARY: Glucose-Capillary: 129 mg/dL — ABNORMAL HIGH (ref 70–99)

## 2017-09-24 LAB — MAGNESIUM: Magnesium: 2.2 mg/dL (ref 1.7–2.4)

## 2017-09-24 LAB — PHOSPHORUS: Phosphorus: 4.1 mg/dL (ref 2.5–4.6)

## 2017-09-24 MED ORDER — ASPIRIN 81 MG PO TBEC: 81.0000 mg | DELAYED_RELEASE_TABLET | Freq: Every day | ORAL | Status: AC

## 2017-09-24 NOTE — Discharge Summary (Signed)
Physician Discharge Summary  Paul PeppersSteven A Macdonald WGN:[id number] DOB: 10/10/1980 DOA: 09/23/2017  PCP: Patient, No Pcp Per  Admit date: 09/23/2017 Discharge date: 09/24/2017  Time spent: 22 minutes minutes  Recommendations for Outpatient Follow-up:  1. Any healthy living and follow-up with PCP    Discharge Diagnoses:  Active Problems:   Panic attacks   Psoriasis   Chest pain   Anxiety and depression   Obesity, Class III, BMI 40-49.9 (morbid obesity) (HCC)   Leukocytosis   Elevated ALT measurement   Tobacco abuse   Wheezing   Hyperglycemia   Discharge Condition: Good  Diet recommendation: Heart healthy diet  Filed Weights   09/23/17 0146 09/23/17 1814  Weight: (!) 149.7 kg (330 lb) (!) 155.1 kg (342 lb)    History of present illness:  Paul PeppersSteven A Armon is a 37 y.o. male with medical history significant of psoriasis, anxiety and depression, intermittent hypertension on any medications, tobacco abuse, morbid obesity, and other comorbidities who [facility] with a chief complaint of chest pain.  Patient states it was a sharp chest pain 7 out of 10 in severity that woke him up from his sleep with associated symptoms of diaphoresis, nausea, and radiation with the pain and numbness in his left hand.  Patient denies any lightheadedness or dizziness.  No GU or GI complaints and no burning or discomfort in urine.  Patient is an active tobacco smoker and smokes 1 pack/day and does have a family history of coronary artery disease were patient's father had an MI at age 35.  The emergency room and given sublingual nitroglycerin which alleviated the pain and reduced it from a 7 out of 10 in severity to 4 out of 10 in severity.  Patient states the chest pain was not as bad and the numbness and tingling was not as severe now his left hand.  Patient's wife at bedside states that he is very diaphoretic and sweaty and because of concern of the chest pain he was brought  into the emergency room for evaluation.  [facility] was called to admit this patient for chest pain rule out ACS.    Hospital Course:  Patient was admitted and serial enzymes were checked x3.  Cardiology was consulted.  He had work-up done including coronary CT scan with calcium score that is reasonable.  Cardiology therefore cleared patient as nonischemic symptoms.  Patient also has hemoglobin A1c of 6.6 suspected need gnosis of diabetes but his blood sugar has remained stable in the hospital.  Referred to his PCP for primary treatment.  He is a tobacco smoker with tobacco cessation counseling given.  Patient is morbidly obese and dietary counseling given.  At this point patient is stable and discharged home to continue with home regimen.  And debilitation time spent 34 minutes.  Procedures: Coronary CT scan with calcium score Consultations:  Cardiology, Dr Tenny Crawoss  Discharge Exam: Vitals:   09/24/17 0925 09/24/17 0945  BP:  124/76  Pulse:  62  Resp:    Temp:  (!) 97.5 F (36.4 C)  SpO2: 97% 96%    General: Stable no acute distress Cardiovascular: Regular rate and rhythm Respiratory: Good air entry bilaterally no wheeze rales or crackles  Discharge Instructions   Discharge Instructions    Diet - low sodium heart healthy   Complete by:  As directed    Increase activity slowly   Complete by:  As directed      Allergies as of 09/24/2017   No  Known Allergies     Medication List    TAKE these medications   aspirin 81 MG EC tablet Take 1 tablet (81 mg total) by mouth daily. Start taking on:  09/25/2017   HYDROcodone-acetaminophen 5-325 MG tablet Commonly known as:  NORCO Take 1-2 tablets by mouth every 4 (four) hours as needed.   HYDROcodone-acetaminophen 5-325 MG tablet Commonly known as:  NORCO Take 1 tablet by mouth every 4 (four) hours as needed for moderate pain.   hydrOXYzine 10 MG tablet Commonly known as:  ATARAX/VISTARIL Take 10 mg by mouth daily as needed for  itching.   ibuprofen 800 MG tablet Commonly known as:  ADVIL,MOTRIN Take 1 tablet (800 mg total) by mouth every 8 (eight) hours as needed.   OTEZLA 30 MG Tabs Generic drug:  Apremilast Take 30 mg by mouth 2 (two) times daily.   predniSONE 50 MG tablet Commonly known as:  DELTASONE Take 1 tablet (50 mg total) by mouth daily.      No Known Allergies    The results of significant diagnostics from this hospitalization (including imaging, microbiology, ancillary and laboratory) are listed below for reference.    Significant Diagnostic Studies: Dg Chest 2 View  Result Date: 09/23/2017 CLINICAL DATA:  37 year old male with chest pain. EXAM: CHEST - 2 VIEW COMPARISON:  Chest radiograph dated 07/29/2014 FINDINGS: The heart size and mediastinal contours are within normal limits. Both lungs are clear. The visualized skeletal structures are unremarkable. IMPRESSION: No active cardiopulmonary disease. Electronically Signed   By: Elgie Collard M.D.   On: 09/23/2017 03:50   Ct Coronary Morph W/cta Cor W/score W/ca W/cm &/or Wo/cm  Addendum Date: 09/23/2017   ADDENDUM REPORT: 09/23/2017 18:13 CLINICAL DATA:  Chest pain EXAM: Cardiac CTA MEDICATIONS: Sub lingual nitro. 4mg  and lopressor 5mg  iv Lopressor 25 mg PO TECHNIQUE: The patient was scanned on a Siemens 192 slice scanner. Gantry rotation speed was 270 msecs. Collimation was .6 mm . A 120 kV prospective scan was triggered in the ascending thoracic aorta at 140 HU's with full mA between 35- of the R-R interval. Average HR during the scan was 58 bpm. The 3D data set was interpreted on a dedicated work station using MPR, MIP and VRT modes. A total of 100 cc of contrast was used. FINDINGS: Non-cardiac: See separate report from [facility]. No significant findings on limited lung and soft tissue windows. Calcium Score: No calcium detected Coronary Arteries: Left  dominant with no anomalies LM: Normal LAD: Normal IM: Normal D1: Normal  Circumflex: Dominant and normal OM1: Normal OM2: Normal PDA: Normal PLA: Normal RCA: Smaller and non dominant normal IMPRESSION: 1. Calcium score 0 2.  Normal aortic root 3.5 cm 3.  Normal left dominant coronary arteries Charlton Haws Electronically Signed   By: Charlton Haws M.D.   On: 09/23/2017 18:13   Result Date: 09/23/2017 EXAM: OVER-READ INTERPRETATION  CT CHEST The following report is an over-read performed by radiologist Dr. Charlett Nose of [facility] on 09/23/2017. This over-read does not include interpretation of cardiac or coronary anatomy or pathology. The coronary CTA interpretation by the cardiologist is attached. COMPARISON:  None. FINDINGS: Vascular: Heart is mildly enlarged. Visualized aorta is normal caliber. Mediastinum/Nodes: No adenopathy in the lower mediastinum or hila. Lungs/Pleura: Subsegmental atelectasis in the right lower lobe. Remainder the lungs clear. No effusions. Upper Abdomen: Diffuse fatty infiltration of the liver. No acute findings. Musculoskeletal: Chest wall soft tissues are unremarkable. No acute bony abnormality. IMPRESSION: Mild cardiomegaly.  Subsegmental atelectasis in the right lower lobe. No acute extra cardiac abnormality. Electronically Signed: By: Charlett Nose M.D. On: 09/23/2017 17:50    Microbiology: No results found for this or any previous visit (from the past 240 hour(s)).   Labs: Basic Metabolic Panel: Recent Labs  Lab 09/23/17 0216 09/23/17 0820 09/23/17 1919 09/24/17 0423  NA 140  --   --  141  K 3.6  --   --  4.0  CL 107  --   --  106  CO2 25  --   --  27  GLUCOSE 139*  --   --  147*  BUN 15  --   --  14  CREATININE 0.85  --  0.90 0.85  CALCIUM 8.7*  --   --  8.5*  MG  --  2.0  --  2.2  PHOS  --  4.0  --  4.1   Liver Function Tests: Recent Labs  Lab 09/23/17 0216 09/24/17 0423  AST 30 26  ALT 57* 52*  ALKPHOS 60 69  BILITOT 0.4 0.4  PROT 6.6 6.3*  ALBUMIN 3.7 3.4*   Recent Labs  Lab 09/23/17 0216  LIPASE 50    No results for input(s): AMMONIA in the last 168 hours. CBC: Recent Labs  Lab 09/23/17 0216 09/23/17 1919 09/24/17 0423  WBC 10.7* 11.8* 9.4  NEUTROABS  --   --  4.4  HGB 15.9 15.8 15.5  HCT 46.4 47.3 45.6  MCV 92.2 92.7 93.1  PLT 280 285 268   Cardiac Enzymes: Recent Labs  Lab 09/23/17 0216 09/23/17 0820 09/23/17 1917  TROPONINI <0.03 <0.03 <0.03   BNP: BNP (last 3 results) No results for input(s): BNP in the last 8760 hours.  ProBNP (last 3 results) No results for input(s): PROBNP in the last 8760 hours.  CBG: Recent Labs  Lab 09/23/17 1500 09/23/17 1621 09/23/17 2116 09/24/17 0808  GLUCAP 93 99 148* 129*       SignedLonia Blood MD.  [facility] 09/24/2017, 10:56 AM

## 2017-09-24 NOTE — Progress Notes (Signed)
Patient given discharge, follow up, and medication instructions, verbalized understanding, IV removed, telemetry monitor removed earlier per patient, personal belongings with patient, family to transport home

## 2018-02-12 ENCOUNTER — Encounter (HOSPITAL_COMMUNITY): Payer: Self-pay | Admitting: Emergency Medicine

## 2018-02-12 ENCOUNTER — Emergency Department (HOSPITAL_COMMUNITY)
Admission: EM | Admit: 2018-02-12 | Discharge: 2018-02-12 | Disposition: A | Discharge status: Home / Self Care | Payer: Self-pay | Attending: Emergency Medicine | Admitting: Emergency Medicine

## 2018-02-12 ENCOUNTER — Other Ambulatory Visit: Payer: Self-pay

## 2018-02-12 DIAGNOSIS — L4050 Arthropathic psoriasis, unspecified: Secondary | ICD-10-CM | POA: Insufficient documentation

## 2018-02-12 DIAGNOSIS — F17200 Nicotine dependence, unspecified, uncomplicated: Secondary | ICD-10-CM | POA: Insufficient documentation

## 2018-02-12 DIAGNOSIS — Z79899 Other long term (current) drug therapy: Secondary | ICD-10-CM | POA: Insufficient documentation

## 2018-02-12 DIAGNOSIS — K029 Dental caries, unspecified: Secondary | ICD-10-CM | POA: Insufficient documentation

## 2018-02-12 MED ORDER — HYDROCODONE-ACETAMINOPHEN 5-325 MG PO TABS
1.0000 | ORAL_TABLET | Freq: Once | ORAL | Status: AC
  Administered 2018-02-12: 1 via ORAL
  Filled 2018-02-12: qty 1

## 2018-02-12 MED ORDER — HYDROCODONE-ACETAMINOPHEN 5-325 MG PO TABS: 1.0000 | ORAL_TABLET | Freq: Four times a day (QID) | ORAL | 0 refills | Status: AC | PRN

## 2018-02-12 NOTE — ED Provider Notes (Signed)
[facility]   CSN: [id number] Arrival date & time: 02/12/18  0149     History   Chief Complaint Chief Complaint  Patient presents with  . Dental Pain    HPI Paul Macdonald is a 37 y.o. male with past medical history significant for psoriatic arthritis, obesity, anxiety who presents for evaluation of dental pain.  Patient states he has had dental pain located to the left lower molars.  Patient states he has history of dental caries.  States his tooth broke off approximately 3 weeks ago.  Has been taking Motrin and Tylenol without relief of his pain.  Rates his pain a 7/10.  Pain does not radiate.  Denies fever, chills, facial swelling, nausea, vomiting, neck pain, neck stiffness, difficulty tolerating oral secretions, cough, chest pain, shortness of breath.  Patient states he tried to make appointment with dentist earlier in the week, however they require payment upfront and he cannot afford this.  Patient describes his pain as a dull aching sensation, a sharp shooting pain when he drinks hot or cold liquids.  Denies additional alleviating or aggravating symptoms.  History obtained from patient.  No interpreter was used.  HPI  Past Medical History:  Diagnosis Date  . Depression     Patient Active Problem List   Diagnosis Date Noted  . Chest pain 09/23/2017  . Anxiety and depression 09/23/2017  . Obesity, Class III, BMI 40-49.9 (morbid obesity) (HCC) 09/23/2017  . Leukocytosis 09/23/2017  . Elevated ALT measurement 09/23/2017  . Tobacco abuse 09/23/2017  . Wheezing 09/23/2017  . Hyperglycemia 09/23/2017  . Increased glucose level 01/24/2016  . Opioid dependence in remission (HCC) 01/23/2016  . Panic attacks 01/23/2016  . Psoriasis 01/23/2016  . Syncope 01/23/2016    Past Surgical History:  Procedure Laterality Date  . 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 Authorizing Provider  Apremilast (OTEZLA) 30 MG TABS Take 30 mg by mouth 2 (two) times daily.    [provider]  aspirin EC 81 MG EC tablet Take 1 tablet (81 mg total) by mouth daily. 09/25/17   Rometta EmeryGarba, Mohammad L, MD  HYDROcodone-acetaminophen (NORCO/VICODIN) 5-325 MG tablet Take 1 tablet by mouth every 6 (six) hours as needed for severe pain. 02/12/18   Henderly, Britni A, PA-C  hydrOXYzine (ATARAX/VISTARIL) 10 MG tablet Take 10 mg by mouth daily as needed for itching.    [provider]    Family History Family History  Problem Relation Age of Onset  . Heart attack Mother 2153  . Diabetes Mother   . Hypertension Mother   . Heart attack Father 5042    Social History Social History   Tobacco Use  . Smoking status: Current Every Day Smoker    Packs/day: 1.00  . Smokeless tobacco: Never Used  Substance Use Topics  . Alcohol use: No  . Drug use: No    Comment: opioid and heroin use in past- per recent [facility] hospital records     Allergies   Patient has no known allergies.   Review of Systems Review of Systems  Constitutional: Negative.   HENT: Positive for dental problem. Negative for congestion, drooling, ear discharge, ear pain, facial swelling, hearing loss, mouth sores, nosebleeds, postnasal drip, rhinorrhea, sinus pressure, sinus  pain, sneezing, sore throat, tinnitus, trouble swallowing and voice change.   Respiratory: Negative for cough, choking, chest tightness, shortness of breath, wheezing and stridor.   Cardiovascular: Negative for chest pain.  Gastrointestinal: Negative for nausea and vomiting.  Musculoskeletal: Negative for neck pain and neck stiffness.  Skin: Negative.   Neurological: Negative for dizziness, weakness and headaches.  All other systems reviewed and are negative.    Physical Exam Updated Vital Signs BP 123/87 (BP Location: Left Arm)    Pulse 70   Temp 98.7 F (37.1 C) (Oral)   Resp 16   SpO2 95%   Physical Exam  Constitutional: Vital signs are normal. He appears well-developed and well-nourished.  Non-toxic appearance. He does not have a sickly appearance. He does not appear ill. No distress.  HENT:  Head: Normocephalic and atraumatic.  Nose: Nose normal. Right sinus exhibits no maxillary sinus tenderness and no frontal sinus tenderness. Left sinus exhibits no maxillary sinus tenderness and no frontal sinus tenderness.  Mouth/Throat: Uvula is midline, oropharynx is clear and moist and mucous membranes are normal. He does not have dentures. No oral lesions. No trismus in the jaw. Abnormal dentition. Dental caries present. No dental abscesses, uvula swelling or lacerations. No oropharyngeal exudate, posterior oropharyngeal edema, posterior oropharyngeal erythema or tonsillar abscesses.    Oropharynx without edema, erythema or exudate.  Patient has overall poor dentition with multiple dental caries.  He does have a fractured tooth to tooth #37.  No gingival erythema or swelling.  No gross periapical abscess.  No tongue swelling.  No submandibular swelling.  No tenderness palpation to mandible or maxilla.   Eyes: Pupils are equal, round, and reactive to light.  Neck: Normal range of motion, full passive range of motion without pain and phonation normal. Neck supple. No spinous process tenderness and no muscular tenderness present. No neck rigidity. No edema, no erythema and normal range of motion present.  No tenderness to palpation.  Full range of motion without difficulty, stiffness or rigidity.  Phonation normal.  No drooling or dysphasia.  Able to tolerate oral secretions without difficulty.  Cardiovascular: Normal rate, regular rhythm, normal heart sounds, intact distal pulses and normal pulses.  Pulmonary/Chest: Effort normal and breath sounds normal. No accessory muscle usage or stridor. No tachypnea. No respiratory distress.  He has no decreased breath sounds. He has no wheezes. He has no rhonchi. He has no rales.  Abdominal: Soft. He exhibits no distension.  Musculoskeletal: Normal range of motion.       Cervical back: Normal.  Neurological: He is alert.  Skin: Skin is warm and dry. No abrasion, no bruising, no ecchymosis, no laceration, no lesion and no rash noted. He is not diaphoretic. No erythema.  No edema, erythema or warmth.  No evidence of facial erythema or swelling.  Psychiatric: He has a normal mood and affect.  Nursing note and vitals reviewed.    ED Treatments / Results  Labs (all labs ordered are listed, but only abnormal results are displayed) Labs Reviewed - No data to display  EKG None  Radiology No results found.  Procedures Procedures (including critical care time)  Medications Ordered in ED Medications  HYDROcodone-acetaminophen (NORCO/VICODIN) 5-325 MG per tablet 1 tablet (1 tablet Oral Given 02/12/18 5784)     Initial Impression / Assessment and Plan / ED Course  I have reviewed the triage vital signs and the nursing notes.  Pertinent labs & imaging results that were available during my care of the patient were  reviewed by me and considered in my medical decision making (see chart for details).  37 year old male who appears otherwise well presents for evaluation of dental pain.  Onset approximately 3 weeks ago.  Patient does have fractured tooth to tooth #37.  He does have overall poor dentition with multiple missing teeth and dental caries.  Dentistry appointment earlier in week, however could not afford copayment and was not able to show up.  Has been taking Tylenol and ibuprofen without relief of symptoms.  Afebrile, nonseptic, non-ill-appearing.  No obvious gingival erythema or swelling.  No gross periapical abscess.  No submandibular swelling.  Able to tolerate oral secretions without difficulty.  No drooling or dysphonia.  Mucous membranes moist without oropharyngeal edema,  erythema or exudate.  Low suspicion for Ludwig's angina or deep space infection.  Discussed with patient he needs close follow-up with dentistry.  He has no evidence of active infection.  Pain is most likely from fractured tooth #37.  Will give pain medication in department.  Patient is not driving home, has a family member picking him up.  Will DC home with short course of pain medication. I have looked patient up in the narcotic database.  He does not have any active narcotic prescriptions.  Hemodynamically stable and appropriate for DC home at this time.  I have discussed return precautions.  Patient voiced understanding and is agreeable for follow-up.    Final Clinical Impressions(s) / ED Diagnoses   Final diagnoses:  Pain due to dental caries    ED Discharge Orders         Ordered    HYDROcodone-acetaminophen (NORCO/VICODIN) 5-325 MG tablet  Every 6 hours PRN     02/12/18 0647           Henderly, Britni A, PA-C 02/12/18 0651    Nira Conn, MD 02/12/18 [phone]

## 2018-02-12 NOTE — Discharge Instructions (Addendum)
You were evaluated today for dental pain.  This is most likely from the fractured tooth in the back of your mouth.  There is no evidence of a deep space infection.  I will prescribe a short course of pain medicine to take.  Take as prescribed.  Do not drive or operate heavy machinery while taking this medicine.  Please follow-up with a dentist soon as possible for reevaluation.  Return to the ED for any worsening symptoms.

## 2018-02-12 NOTE — ED Triage Notes (Signed)
Pt from home with c/o dental pain to lower left jaw. Pt has obvious dental decay. Pt states he needs referral to dentist. Pt is afebrile

## 2018-05-28 ENCOUNTER — Emergency Department (HOSPITAL_COMMUNITY)
Admission: EM | Admit: 2018-05-28 | Discharge: 2018-05-28 | Disposition: A | Discharge status: Home / Self Care | Payer: Self-pay | Attending: Emergency Medicine | Admitting: Emergency Medicine

## 2018-05-28 ENCOUNTER — Encounter (HOSPITAL_COMMUNITY): Payer: Self-pay

## 2018-05-28 ENCOUNTER — Other Ambulatory Visit: Payer: Self-pay

## 2018-05-28 DIAGNOSIS — Z7982 Long term (current) use of aspirin: Secondary | ICD-10-CM | POA: Insufficient documentation

## 2018-05-28 DIAGNOSIS — F17210 Nicotine dependence, cigarettes, uncomplicated: Secondary | ICD-10-CM | POA: Insufficient documentation

## 2018-05-28 DIAGNOSIS — Z720 Tobacco use: Secondary | ICD-10-CM

## 2018-05-28 DIAGNOSIS — Z79899 Other long term (current) drug therapy: Secondary | ICD-10-CM | POA: Insufficient documentation

## 2018-05-28 DIAGNOSIS — K029 Dental caries, unspecified: Secondary | ICD-10-CM | POA: Insufficient documentation

## 2018-05-28 MED ORDER — KETOROLAC TROMETHAMINE 30 MG/ML IJ SOLN
30.0000 mg | Freq: Once | INTRAMUSCULAR | Status: AC
  Administered 2018-05-28: 30 mg via INTRAMUSCULAR
  Filled 2018-05-28: qty 1

## 2018-05-28 NOTE — Discharge Instructions (Signed)
Apply warm or cool compresses to jaw throughout the day. Take your home antibiotic until finished. Alternate between tylenol and motrin as needed for pain. Perform salt water swishes to help with pain/swelling. Use over the counter oragel as needed for additional relief. STOP SMOKING! Followup with a dentist is very important for ongoing evaluation and management of recurrent dental pain, call the dentist listed above in the next 24-48 hours to schedule ongoing dental care, or use the list below to find a dentist in the next 24-48 hours for ongoing management of your dental issue. Return to emergency department for emergent changing or worsening symptoms.

## 2018-05-28 NOTE — ED Triage Notes (Signed)
Pt states he has had an issue with this tooth (left lower) since December. Pt states that he saw a dentist, who said he needed an oral Careers adviser. Pt states he was seen in [place] last week, where they gave him penicillin, which helped the swelling, but still has a lot of pain. Pt is a Investment banker, operational, and is out of work, unable to afford the Transport planner

## 2018-05-28 NOTE — ED Provider Notes (Signed)
[facility] Provider Note   CSN: [id number] Arrival date & time: 05/28/18  1154    History   Chief Complaint Chief Complaint  Patient presents with  . Dental Pain    HPI    Paul Macdonald is a 38 y.o. male with a PMHx of depression, psoriasis, psoriatic arthritis, and other conditions listed below, who presents to the ED with complaints of left lower dental pain for the last week.  Patient has had issues with this tooth in the past, states that it is broken and recently broke further.  He was seen in the ED in [place] and placed on penicillin, states that his gum swelling is improving but he continues to have pain.  He describes the pain as 9/10 constant sharp and throbbing nonradiating left lower dental pain that worsens with hot or cold exposure, and has been unrelieved with diclofenac and Motrin.  He reports associated gum swelling but states that this is improving.  He is a smoker.  He does not have a dentist.  He was told to follow-up with an oral surgeon but cannot afford to do so at this time.  He denies any gum drainage, drooling, trismus, fevers, chills, or any other complaints at this time.  The history is provided by the patient and medical records. No language interpreter was used.  Dental Pain  Associated symptoms: facial swelling (improving)   Associated symptoms: no fever     Past Medical History:  Diagnosis Date  . Depression     Patient Active Problem List   Diagnosis Date Noted  . Chest pain 09/23/2017  . Anxiety and depression 09/23/2017  . Obesity, Class III, BMI 40-49.9 (morbid obesity) (HCC) 09/23/2017  . Leukocytosis 09/23/2017  . Elevated ALT measurement 09/23/2017  . Tobacco abuse 09/23/2017  . Wheezing 09/23/2017  . Hyperglycemia 09/23/2017  . Increased glucose level 01/24/2016  . Opioid dependence in remission (HCC) 01/23/2016  . Panic attacks 01/23/2016  . Psoriasis 01/23/2016  . Syncope 01/23/2016     Past Surgical History:  Procedure Laterality Date  . 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 Authorizing Provider  Apremilast (OTEZLA) 30 MG TABS Take 30 mg by mouth 2 (two) times daily.    [provider]  aspirin EC 81 MG EC tablet Take 1 tablet (81 mg total) by mouth daily. 09/25/17   Rometta Emery, MD  HYDROcodone-acetaminophen (NORCO/VICODIN) 5-325 MG tablet Take 1 tablet by mouth every 6 (six) hours as needed for severe pain. 02/12/18   Henderly, Britni A, PA-C  hydrOXYzine (ATARAX/VISTARIL) 10 MG tablet Take 10 mg by mouth daily as needed for itching.    [provider]    Family History Family History  Problem Relation Age of Onset  . Heart attack Mother 88  . Diabetes Mother   . Hypertension Mother   . Heart attack Father 60    Social History Social History   Tobacco Use  . Smoking status: Current Every Day Smoker    Packs/day: 1.00  . Smokeless tobacco: Never Used  Substance Use Topics  . Alcohol use: No  . Drug use: No    Comment: opioid and heroin use in past- per recent [facility] hospital records     Allergies   Patient  has no known allergies.   Review of Systems Review of Systems  Constitutional: Negative for chills and fever.  HENT: Positive for dental problem and facial swelling (improving). Negative for trouble swallowing.   Allergic/Immunologic: Negative for immunocompromised state.     Physical Exam Updated Vital Signs BP (!) 149/98 (BP Location: Right Arm)   Pulse 91   Temp 98.1 F (36.7 C) (Oral)   Resp 18   Wt (!) 155 kg   SpO2 99%   BMI 43.87 kg/m   Physical Exam Vitals signs and nursing note reviewed.  Constitutional:      General: He is not in acute distress.    Appearance: Normal appearance. He is well-developed. He is not toxic-appearing.     Comments:  Afebrile, nontoxic, NAD  HENT:     Head: Normocephalic and atraumatic.     Mouth/Throat:     Mouth: Mucous membranes are moist.     Dentition: Dental tenderness and dental caries present. No gingival swelling.     Pharynx: Oropharynx is clear. Uvula midline. No pharyngeal swelling, oropharyngeal exudate, posterior oropharyngeal erythema or uvula swelling.     Tonsils: No tonsillar exudate or tonsillar abscesses.     Comments: L lower posterior molar decayed with caries with mild TTP, with minimal surrounding gingival erythema but no swelling, no definite abscess, no evidence of ludwig's.  Oropharynx clear and moist, without uvular swelling or deviation, no trismus or drooling, no tonsillar swelling or erythema, no exudates.    Eyes:     General:        Right eye: No discharge.        Left eye: No discharge.     Conjunctiva/sclera: Conjunctivae normal.  Neck:     Musculoskeletal: Normal range of motion and neck supple.  Cardiovascular:     Rate and Rhythm: Normal rate.     Pulses: Normal pulses.  Pulmonary:     Effort: Pulmonary effort is normal. No respiratory distress.  Abdominal:     General: There is no distension.  Musculoskeletal: Normal range of motion.  Skin:    General: Skin is warm and dry.     Findings: No rash.  Neurological:     Mental Status: He is alert and oriented to person, place, and time.     Sensory: Sensation is intact. No sensory deficit.     Motor: Motor function is intact.  Psychiatric:        Mood and Affect: Mood and affect normal.        Behavior: Behavior normal.      ED Treatments / Results  Labs (all labs ordered are listed, but only abnormal results are displayed) Labs Reviewed - No data to display  EKG None  Radiology No results found.  Procedures Procedures (including critical care time)  Medications Ordered in ED Medications  ketorolac (TORADOL) 30 MG/ML injection 30 mg (30 mg Intramuscular Given 05/28/18 1230)     Initial  Impression / Assessment and Plan / ED Course  I have reviewed the triage vital signs and the nursing notes.  Pertinent labs & imaging results that were available during my care of the patient were reviewed by me and considered in my medical decision making (see chart for details).        38 y.o. male here with recurrent dental pain x1 wk, already on PCN and swelling improving but still in pain. On exam, mild gingival erythema but no swelling or abscess, with patient afebrile, non toxic appearing  and swallowing secretions well, no evidence of ludwig's. Doubt need for further emergent intervention at this time. I gave patient referral to dentist/resource guide of dentists and stressed the importance of dental follow up for ultimate management of dental pain.  I have also discussed reasons to return immediately to the ER.  Patient expresses understanding and agrees with plan. Doubt need for changing abx course at this point as this is helping; discussed OTC remedies for pain control. Smoking cessation advised.    Final Clinical Impressions(s) / ED Diagnoses   Final diagnoses:  Pain due to dental caries  Tobacco user    ED Discharge Orders    [address], [place], [place] 05/28/18 1234    Vanetta Mulders, MD 06/01/18 2053

## 2018-10-09 ENCOUNTER — Encounter (HOSPITAL_COMMUNITY): Payer: Self-pay

## 2018-10-09 ENCOUNTER — Other Ambulatory Visit: Payer: Self-pay

## 2018-10-09 ENCOUNTER — Emergency Department (HOSPITAL_COMMUNITY)
Admission: EM | Admit: 2018-10-09 | Discharge: 2018-10-09 | Disposition: A | Discharge status: Home / Self Care | Payer: Self-pay | Attending: Emergency Medicine | Admitting: Emergency Medicine

## 2018-10-09 DIAGNOSIS — R042 Hemoptysis: Secondary | ICD-10-CM | POA: Insufficient documentation

## 2018-10-09 DIAGNOSIS — R079 Chest pain, unspecified: Secondary | ICD-10-CM | POA: Insufficient documentation

## 2018-10-09 DIAGNOSIS — Z87891 Personal history of nicotine dependence: Secondary | ICD-10-CM | POA: Insufficient documentation

## 2018-10-09 LAB — COMPREHENSIVE METABOLIC PANEL
ALT: 40 U/L (ref 0–44)
AST: 26 U/L (ref 15–41)
Albumin: 3.9 g/dL (ref 3.5–5.0)
Alkaline Phosphatase: 59 U/L (ref 38–126)
Anion gap: 9 (ref 5–15)
BUN: 12 mg/dL (ref 6–20)
CO2: 25 mmol/L (ref 22–32)
Calcium: 8.5 mg/dL — ABNORMAL LOW (ref 8.9–10.3)
Chloride: 104 mmol/L (ref 98–111)
Creatinine, Ser: 0.9 mg/dL (ref 0.61–1.24)
GFR calc Af Amer: >60 mL/min (ref >60)
GFR calc non Af Amer: >60 mL/min (ref >60)
Glucose, Bld: 120 mg/dL — ABNORMAL HIGH (ref 70–99)
Potassium: 4.1 mmol/L (ref 3.5–5.1)
Sodium: 138 mmol/L (ref 135–145)
Total Bilirubin: 0.4 mg/dL (ref 0.3–1.2)
Total Protein: 7 g/dL (ref 6.5–8.1)

## 2018-10-09 LAB — CBC
HCT: 48.8 % (ref 39.0–52.0)
Hemoglobin: 16 g/dL (ref 13.0–17.0)
MCH: 30.7 pg (ref 26.0–34.0)
MCHC: 32.8 g/dL (ref 30.0–36.0)
MCV: 93.7 fL (ref 80.0–100.0)
Platelets: 247 10*3/uL (ref 150–400)
RBC: 5.21 MIL/uL (ref 4.22–5.81)
RDW: 13 % (ref 11.5–15.5)
WBC: 7.7 10*3/uL (ref 4.0–10.5)
nRBC: 0 % (ref 0.0–0.2)

## 2018-10-09 LAB — TROPONIN I (HIGH SENSITIVITY): Troponin I (High Sensitivity): 3 ng/L (ref <18)

## 2018-10-09 LAB — TYPE AND SCREEN
ABO/RH(D): O POS
Antibody Screen: NEGATIVE

## 2018-10-09 NOTE — ED Notes (Signed)
Pt gave his labels back to registration and left.

## 2018-10-09 NOTE — ED Triage Notes (Signed)
Pt states that he woke up this morning and had dark red blood when he coughed. Pt states that he started having left sided chest pain as well.

## 2018-10-09 NOTE — ED Provider Notes (Signed)
MSE was initiated and I personally evaluated the patient and placed orders (if any) at  3:30 PM on October 09, 2018.  The patient appears stable so that the remainder of the MSE may be completed by another provider.  Patient placed in Quick Look pathway, seen and evaluated   Chief Complaint: cp/ hemoptysis  HPI:   Patient had 1 episode of hemoptysis this morning. Followed by persistent Left sided pleuritic cp. Hx of smoking No hx of Cancer, confinement, surgery, blood clots or dvt  ROS: cp (one)  Physical Exam:   Gen: No distress  Neuro: Awake and Alert  Skin: Warm    Focused Exam: well appearing but anxious   Initiation of care has begun. The patient has been counseled on the process, plan, and necessity for staying for the completion/evaluation, and the remainder of the medical screening examination    Margarita Mail, PA-C 10/09/18 1545    Dorie Rank, MD 10/18/18 1159

## 2018-10-10 LAB — ABO/RH: ABO/RH(D): O POS

## 2019-05-26 ENCOUNTER — Emergency Department (HOSPITAL_COMMUNITY)
Admission: EM | Admit: 2019-05-26 | Discharge: 2019-05-26 | Disposition: A | Discharge status: Home / Self Care | Payer: Self-pay | Attending: Emergency Medicine | Admitting: Emergency Medicine

## 2019-05-26 ENCOUNTER — Other Ambulatory Visit: Payer: Self-pay

## 2019-05-26 ENCOUNTER — Encounter (HOSPITAL_COMMUNITY): Payer: Self-pay

## 2019-05-26 DIAGNOSIS — Z79899 Other long term (current) drug therapy: Secondary | ICD-10-CM | POA: Insufficient documentation

## 2019-05-26 DIAGNOSIS — Z7982 Long term (current) use of aspirin: Secondary | ICD-10-CM | POA: Insufficient documentation

## 2019-05-26 DIAGNOSIS — Z765 Malingerer [conscious simulation]: Secondary | ICD-10-CM | POA: Insufficient documentation

## 2019-05-26 DIAGNOSIS — F17210 Nicotine dependence, cigarettes, uncomplicated: Secondary | ICD-10-CM | POA: Insufficient documentation

## 2019-05-26 DIAGNOSIS — K0889 Other specified disorders of teeth and supporting structures: Secondary | ICD-10-CM | POA: Insufficient documentation

## 2019-05-26 MED ORDER — ACETAMINOPHEN 500 MG PO TABS
1000.0000 mg | ORAL_TABLET | Freq: Once | ORAL | Status: AC
  Administered 2019-05-26: 1000 mg via ORAL
  Filled 2019-05-26: qty 2

## 2019-05-26 MED ORDER — LIDOCAINE VISCOUS HCL 2 % MT SOLN
15.0000 mL | Freq: Once | OROMUCOSAL | Status: AC
  Administered 2019-05-26: 15 mL via OROMUCOSAL
  Filled 2019-05-26: qty 15

## 2019-05-26 NOTE — ED Notes (Signed)
Pt lying in bed. Full monitor placed. NAD noted. Will continue to monitor.

## 2019-05-26 NOTE — ED Triage Notes (Signed)
Pt BIB [facility] from home with c/o of dental pain Left Lower side. Pt had a tooth break off and has experienced pain x3 days. Pt has an appointment with Emergency Dentist on Monday. Pt reports taking Motrin 3hrs ago with no relief. Pt is compliant with PCN that he is currently taking.   Pt was administered 20mg  Ketamine at 0130 by EMS due to being clean for ETOH and heroin x5 years. Before med administration pt was diaphoretic, 10/10 pain and tachycardic.

## 2019-05-26 NOTE — ED Provider Notes (Signed)
[facility] DEPT Provider Note   CSN: [id number] Arrival date & time: 05/26/19  0155     History Chief Complaint  Patient presents with  . Dental Pain    Paul Macdonald is a 39 y.o. male.  The history is provided by the patient.  Dental Pain Location:  Lower Lower teeth location:  19/LL 1st molar Quality:  Aching Severity:  Severe Onset quality:  Gradual Timing:  Constant Progression:  Worsening Chronicity:  Chronic Context: dental caries and dental fracture   Previous work-up:  Dental exam Relieved by:  Nothing Worsened by:  Nothing Ineffective treatments: 24 hours of antibiotics and toradol.  given ketamine by ems en route. Associated symptoms: no congestion, no difficulty swallowing, no drooling, no facial pain, no facial swelling, no fever, no gum swelling, no headaches, no neck pain, no neck swelling, no oral bleeding, no oral lesions and no trismus   Risk factors: no cancer   Patient with a h/o opioid abuse with ongoing dental pain for months with a partially cracked tooth that then worsened approximately 4 days ago.  Seen at [facility] and RX given for penicillin.  He began those on Wednesday.  Then seen again at [facility] this evening for same. Then called EMS because pain was not alleviated and they gave ketamine.       Past Medical History:  Diagnosis Date  . Depression     Patient Active Problem List   Diagnosis Date Noted  . Chest pain 09/23/2017  . Anxiety and depression 09/23/2017  . Obesity, Class III, BMI 40-49.9 (morbid obesity) ([place]) 09/23/2017  . Leukocytosis 09/23/2017  . Elevated ALT measurement 09/23/2017  . Tobacco abuse 09/23/2017  . Wheezing 09/23/2017  . Hyperglycemia 09/23/2017  . Increased glucose level 01/24/2016  . Opioid dependence in remission ([place]) 01/23/2016  . Panic attacks 01/23/2016  . Psoriasis 01/23/2016  . Syncope 01/23/2016    Past Surgical History:  Procedure Laterality Date  . 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 Heart attack Mother 13  . Diabetes Mother   . Hypertension Mother   . Heart attack Father 30    Social History   Tobacco Use  . Smoking status: Current Every Day Smoker    Packs/day: 1.00    Types: Cigarettes  . Smokeless tobacco: Never Used  Substance Use Topics  . Alcohol use: No  . Drug use: No    Comment: opioid and heroin use in past- per recent [facility] hospital records    Home Medications Prior to Admission medications   Medication Sig Start Date End Date Taking? Authorizing Provider  Apremilast (OTEZLA) 30 MG TABS Take 30 mg by mouth 2 (two) times daily.    [provider]  aspirin EC 81 MG EC tablet Take 1 tablet (81 mg total) by mouth daily. 09/25/17   Elwyn Reach, MD  HYDROcodone-acetaminophen (NORCO/VICODIN) 5-325 MG tablet Take 1 tablet by mouth every 6 (six) hours as needed for severe pain. 02/12/18   Henderly, Britni A, PA-C  hydrOXYzine (ATARAX/VISTARIL) 10 MG tablet Take 10 mg by mouth daily as needed for itching.    [provider]    Allergies    Patient has no known allergies.  Review of Systems   Review of Systems  Constitutional: Negative for fever.  HENT: Positive for dental  problem. Negative for congestion, drooling, facial swelling and mouth sores.   Eyes: Negative for visual disturbance.  Respiratory: Negative for shortness of breath.   Cardiovascular: Negative for chest pain.  Gastrointestinal: Negative for abdominal pain.  Genitourinary: Negative for difficulty urinating.  Musculoskeletal: Negative for neck pain.  Skin: Negative for rash.  Neurological: Negative for headaches.  Psychiatric/Behavioral: Negative for agitation.  All other systems reviewed and are negative.   Physical Exam Updated Vital Signs BP 138/74 (BP Location: Left Arm)   Pulse 77   Temp 98.7 F (37.1 C) (Oral)   Resp 15    Ht 6\' 2"  (1.88 m)   Wt (!) 145.2 kg   SpO2 94%   BMI 41.09 kg/m   Physical Exam Vitals and nursing note reviewed.  Constitutional:      General: He is not in acute distress.    Appearance: Normal appearance.  HENT:     Head: Normocephalic and atraumatic.     Nose: Nose normal.     Mouth/Throat:     Mouth: Mucous membranes are moist.     Pharynx: Oropharynx is clear.     Comments: Ellis 2 fracture with necrosis LL 1st molar Eyes:     Conjunctiva/sclera: Conjunctivae normal.     Pupils: Pupils are equal, round, and reactive to light.  Cardiovascular:     Rate and Rhythm: Normal rate and regular rhythm.     Pulses: Normal pulses.     Heart sounds: Normal heart sounds.  Pulmonary:     Effort: Pulmonary effort is normal.     Breath sounds: Normal breath sounds.  Abdominal:     General: Abdomen is flat. Bowel sounds are normal.     Tenderness: There is no abdominal tenderness. There is no guarding.  Musculoskeletal:        General: Normal range of motion.     Cervical back: Normal range of motion and neck supple.  Skin:    General: Skin is warm and dry.     Capillary Refill: Capillary refill takes less than 2 seconds.  Neurological:     General: No focal deficit present.     Mental Status: He is alert and oriented to person, place, and time.  Psychiatric:        Mood and Affect: Mood normal.        Behavior: Behavior normal.     ED Results / Procedures / Treatments   Labs (all labs ordered are listed, but only abnormal results are displayed) Labs Reviewed - No data to display  EKG None  Radiology No results found.  Procedures Procedures (including critical care time)  Medications Ordered in ED Medications  lidocaine (XYLOCAINE) 2 % viscous mouth solution 15 mL (has no administration in time range)  acetaminophen (TYLENOL) tablet 1,000 mg (has no administration in time range)    ED Course  I have reviewed the triage vital signs and the nursing  notes.  Pertinent labs & imaging results that were available during my care of the patient were reviewed by me and considered in my medical decision making (see chart for details).  Patient has an appointment Monday with dentist.  Alternate tylenol and motrin and may add oragel.  Patient has not been on a therapeutic course of antibiotics yet and I will not change them as he has only taken 1 days worth.  Mouth rinses and follow up with dentistry.    Paul Macdonald was evaluated in Emergency Department on 05/26/2019 for the  symptoms described in the history of present illness. He was evaluated in the context of the global COVID-19 pandemic, which necessitated consideration that the patient might be at risk for infection with the SARS-CoV-2 virus that causes COVID-19. Institutional protocols and algorithms that pertain to the evaluation of patients at risk for COVID-19 are in a state of rapid change based on information released by regulatory bodies including the CDC and federal and state organizations. These policies and algorithms were followed during the patient's care in the ED.   Final Clinical Impression(s) / ED Diagnoses Final diagnoses:  Pain, dental    Return for weakness, numbness, changes in vision or speech, fevers >100.4 unrelieved by medication, shortness of breath, intractable vomiting, or diarrhea, abdominal pain, Inability to tolerate liquids or food, cough, altered mental status or any concerns. No signs of systemic illness or infection. The patient is nontoxic-appearing on exam and vital signs are within normal limits.   I have reviewed the triage vital signs and the nursing notes. Pertinent labs &imaging results that were available during my care of the patient were reviewed by me and considered in my medical decision making (see chart for details).  After history, exam, and medical workup I feel the patient has been appropriately medically screened and is safe for  discharge home. Pertinent diagnoses were discussed with the patient. Patient was given return precautions      Tali Coster, MD 05/26/19 0160

## 2019-05-26 NOTE — Discharge Instructions (Addendum)
Oragel topical pain reliever

## 2019-05-26 NOTE — ED Notes (Signed)
Pt lying in bed. NAD noted. Pt is ready for d/c 

## 2019-05-26 NOTE — ED Triage Notes (Signed)
Pt lying in bed. Monitor placed. Pt denies any needs. NAD noted. Will continue to monitor.

## 2020-03-14 ENCOUNTER — Emergency Department (HOSPITAL_BASED_OUTPATIENT_CLINIC_OR_DEPARTMENT_OTHER)
Admission: EM | Admit: 2020-03-14 | Discharge: 2020-03-15 | Disposition: A | Discharge status: Home / Self Care | Payer: Self-pay | Attending: Emergency Medicine | Admitting: Emergency Medicine

## 2020-03-14 ENCOUNTER — Other Ambulatory Visit: Payer: Self-pay

## 2020-03-14 ENCOUNTER — Emergency Department (HOSPITAL_BASED_OUTPATIENT_CLINIC_OR_DEPARTMENT_OTHER): Payer: Self-pay

## 2020-03-14 ENCOUNTER — Encounter (HOSPITAL_BASED_OUTPATIENT_CLINIC_OR_DEPARTMENT_OTHER): Payer: Self-pay | Admitting: *Deleted

## 2020-03-14 DIAGNOSIS — J101 Influenza due to other identified influenza virus with other respiratory manifestations: Secondary | ICD-10-CM

## 2020-03-14 DIAGNOSIS — L0502 Pilonidal sinus with abscess: Secondary | ICD-10-CM | POA: Insufficient documentation

## 2020-03-14 DIAGNOSIS — J1089 Influenza due to other identified influenza virus with other manifestations: Secondary | ICD-10-CM | POA: Insufficient documentation

## 2020-03-14 DIAGNOSIS — F17210 Nicotine dependence, cigarettes, uncomplicated: Secondary | ICD-10-CM | POA: Insufficient documentation

## 2020-03-14 DIAGNOSIS — R109 Unspecified abdominal pain: Secondary | ICD-10-CM | POA: Insufficient documentation

## 2020-03-14 DIAGNOSIS — Z7982 Long term (current) use of aspirin: Secondary | ICD-10-CM | POA: Insufficient documentation

## 2020-03-14 DIAGNOSIS — L0501 Pilonidal cyst with abscess: Secondary | ICD-10-CM

## 2020-03-14 LAB — LACTIC ACID, PLASMA: Lactic Acid, Venous: 0.8 mmol/L (ref 0.5–1.9)

## 2020-03-14 LAB — CBC WITH DIFFERENTIAL/PLATELET
Abs Immature Granulocytes: 0.03 10*3/uL (ref 0.00–0.07)
Basophils Absolute: 0.1 10*3/uL (ref 0.0–0.1)
Basophils Relative: 1 %
Eosinophils Absolute: 0.3 10*3/uL (ref 0.0–0.5)
Eosinophils Relative: 3 %
HCT: 44.3 % (ref 39.0–52.0)
Hemoglobin: 15 g/dL (ref 13.0–17.0)
Immature Granulocytes: 0 %
Lymphocytes Relative: 33 %
Lymphs Abs: 2.9 10*3/uL (ref 0.7–4.0)
MCH: 31.2 pg (ref 26.0–34.0)
MCHC: 33.9 g/dL (ref 30.0–36.0)
MCV: 92.1 fL (ref 80.0–100.0)
Monocytes Absolute: 1 10*3/uL (ref 0.1–1.0)
Monocytes Relative: 11 %
Neutro Abs: 4.6 10*3/uL (ref 1.7–7.7)
Neutrophils Relative %: 52 %
Platelets: 223 10*3/uL (ref 150–400)
RBC: 4.81 MIL/uL (ref 4.22–5.81)
RDW: 12.1 % (ref 11.5–15.5)
WBC: 8.8 10*3/uL (ref 4.0–10.5)
nRBC: 0 % (ref 0.0–0.2)

## 2020-03-14 LAB — COMPREHENSIVE METABOLIC PANEL
ALT: 37 U/L (ref 0–44)
AST: 29 U/L (ref 15–41)
Albumin: 3.9 g/dL (ref 3.5–5.0)
Alkaline Phosphatase: 56 U/L (ref 38–126)
Anion gap: 11 (ref 5–15)
BUN: 17 mg/dL (ref 6–20)
CO2: 23 mmol/L (ref 22–32)
Calcium: 8.4 mg/dL — ABNORMAL LOW (ref 8.9–10.3)
Chloride: 99 mmol/L (ref 98–111)
Creatinine, Ser: 1.24 mg/dL (ref 0.61–1.24)
GFR, Estimated: >60 mL/min (ref >60)
Glucose, Bld: 126 mg/dL — ABNORMAL HIGH (ref 70–99)
Potassium: 4 mmol/L (ref 3.5–5.1)
Sodium: 133 mmol/L — ABNORMAL LOW (ref 135–145)
Total Bilirubin: 0.5 mg/dL (ref 0.3–1.2)
Total Protein: 7.1 g/dL (ref 6.5–8.1)

## 2020-03-14 MED ORDER — KETOROLAC TROMETHAMINE 30 MG/ML IJ SOLN
30.0000 mg | Freq: Once | INTRAMUSCULAR | Status: AC
  Administered 2020-03-14: 30 mg via INTRAVENOUS
  Filled 2020-03-14: qty 1

## 2020-03-14 MED ORDER — IBUPROFEN 800 MG PO TABS: 800.0000 mg | ORAL_TABLET | Freq: Three times a day (TID) | ORAL | 0 refills | Status: AC

## 2020-03-14 MED ORDER — FENTANYL CITRATE (PF) 100 MCG/2ML IJ SOLN
50.0000 ug | Freq: Once | INTRAMUSCULAR | Status: AC
  Administered 2020-03-15: 50 ug via INTRAVENOUS
  Filled 2020-03-14: qty 2

## 2020-03-14 MED ORDER — DOXYCYCLINE HYCLATE 100 MG PO CAPS: 100.0000 mg | ORAL_CAPSULE | Freq: Two times a day (BID) | ORAL | 0 refills | Status: DC

## 2020-03-14 MED ORDER — AMOXICILLIN-POT CLAVULANATE 875-125 MG PO TABS: 1.0000 | ORAL_TABLET | Freq: Two times a day (BID) | ORAL | 0 refills | Status: AC

## 2020-03-14 NOTE — ED Notes (Signed)
Pt transported to CT ?

## 2020-03-14 NOTE — Discharge Instructions (Addendum)
Flu test today was positive.  This is likely the cause of your symptoms.  You are contagious, you will need to quarantine.  Please take the Augmentin for your cyst, continue to do warm sits baths and apply warm compress.  Otherwise your work-up today was reassuring.  Please return to the ER for any new or worsening symptoms.

## 2020-03-14 NOTE — ED Provider Notes (Incomplete)
[facility] EMERGENCY DEPARTMENT Provider Note   CSN: [id number] Arrival date & time: 03/14/20  2057     History Chief Complaint  Patient presents with  . Abscess    Paul Macdonald is a 40 y.o. male.  HPI 40 year old male with a history of depression, hyperglycemia, obesity, panic attacks, psoriasis, opioid dependence in remission presents to the ER with complaints of fevers, chills, feeling weak, bilateral back pain and an abscess on his buttocks.  Patient states he has a history of abscess there, states this popped up about a week ago.  States he started to develop some chills, and weakness over the last few days.  He went to urgent care and had a rapid test and PCR done for Covid and this was negative.  He is vaccinated.  States that he has felt like he has had more urgency when urinating, but is not urinating on himself.  No dysuria or hematuria.  States he has been urinating for longer periods of time.  He also reports some abdominal fullness.  Denies any numbness or tingling to his lower extremities.  No loss of bowel bladder control.  No prior history of kidney stones.    Past Medical History:  Diagnosis Date  . Depression     Patient Active Problem List   Diagnosis Date Noted  . Chest pain 09/23/2017  . Anxiety and depression 09/23/2017  . Obesity, Class III, BMI 40-49.9 (morbid obesity) ([facility]) 09/23/2017  . Leukocytosis 09/23/2017  . Elevated ALT measurement 09/23/2017  . Tobacco abuse 09/23/2017  . Wheezing 09/23/2017  . Hyperglycemia 09/23/2017  . Increased glucose level 01/24/2016  . Opioid dependence in remission ([facility]) 01/23/2016  . Panic attacks 01/23/2016  . Psoriasis 01/23/2016  . Syncope 01/23/2016    Past Surgical History:  Procedure Laterality Date  . 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 Heart attack Mother 58  . Diabetes Mother   .  Hypertension Mother   . Heart attack Father 49    Social History   Tobacco Use  . Smoking status: Current Every Day Smoker    Packs/day: 1.00    Types: Cigarettes  . Smokeless tobacco: Never Used  Substance Use Topics  . Alcohol use: No  . Drug use: No    Comment: opioid and heroin use in past- per recent [facility] hospital records    Home Medications Prior to Admission medications   Medication Sig Start Date End Date Taking? Authorizing Provider  amoxicillin-clavulanate (AUGMENTIN) 875-125 MG tablet Take 1 tablet by mouth every 12 (twelve) hours. 03/14/20  Yes Mare Ferrari, PA-C  ibuprofen (ADVIL) 800 MG tablet Take 1 tablet (800 mg total) by mouth 3 (three) times daily. 03/14/20  Yes Mare Ferrari, PA-C  Apremilast (OTEZLA) 30 MG TABS Take 30 mg by mouth 2 (two) times daily.    [provider]  aspirin EC 81 MG EC tablet Take 1 tablet (81 mg total) by mouth daily. 09/25/17   Rometta Emery, MD  HYDROcodone-acetaminophen (NORCO/VICODIN) 5-325 MG tablet Take 1 tablet by mouth every 6 (six) hours as needed for severe pain. 02/12/18   Henderly, Britni A, PA-C  hydrOXYzine (ATARAX/VISTARIL) 10 MG tablet Take 10 mg by mouth daily as needed for itching.    [provider]    Allergies    Patient  has no known allergies.  Review of Systems   Review of Systems  Constitutional: Negative for chills and fever.  HENT: Negative for ear pain and sore throat.   Eyes: Negative for pain and visual disturbance.  Respiratory: Negative for cough and shortness of breath.   Cardiovascular: Negative for chest pain and palpitations.  Gastrointestinal: Negative for abdominal pain, diarrhea and vomiting.  Genitourinary: Positive for urgency. Negative for decreased urine volume, dysuria, frequency and hematuria.  Musculoskeletal: Positive for back pain. Negative for arthralgias.  Skin: Positive for wound. Negative for color change and rash.  Neurological: Negative for seizures and  syncope.  All other systems reviewed and are negative.   Physical Exam Updated Vital Signs BP (!) 107/58 (BP Location: Right Arm)   Pulse 80   Temp 98.2 F (36.8 C) (Oral)   Resp 16   SpO2 95%   Physical Exam Vitals and nursing note reviewed.  Constitutional:      General: He is not in acute distress.    Appearance: He is well-developed and well-nourished. He is not ill-appearing, toxic-appearing or diaphoretic.  HENT:     Head: Normocephalic and atraumatic.  Eyes:     Conjunctiva/sclera: Conjunctivae normal.  Cardiovascular:     Rate and Rhythm: Normal rate and regular rhythm.     Pulses: Normal pulses.     Heart sounds: Normal heart sounds. No murmur heard.   Pulmonary:     Effort: Pulmonary effort is normal. No respiratory distress.     Breath sounds: Normal breath sounds.  Abdominal:     Palpations: Abdomen is soft.     Tenderness: There is no abdominal tenderness. There is right CVA tenderness and left CVA tenderness.  Musculoskeletal:        General: Tenderness present. No edema. Normal range of motion.     Cervical back: Neck supple.     Right lower leg: No edema.     Left lower leg: No edema.     Comments: Patient with bilateral flank tenderness, paraspinal muscle tenderness on exam.  No C, T, L-spine tenderness.  Moving all 4 extremities without difficulty.  5/5 strength in upper and lower extremities bilaterally.  Neurovascularly intact.  Skin:    General: Skin is warm and dry.     Findings: Erythema present.     Comments: Skin exam consistent with pilonidal abscess.  Largely indurated, no central fluctuance.    Neurological:     General: No focal deficit present.     Mental Status: He is alert.     Sensory: No sensory deficit.     Motor: No weakness.  Psychiatric:        Mood and Affect: Mood and affect normal.     ED Results / Procedures / Treatments   Labs (all labs ordered are listed, but only abnormal results are displayed) Labs Reviewed  RAPID  INFLUENZA A&B ANTIGENS - Abnormal; Notable for the following components:      Result Value   Influenza A ([facility]) POSITIVE (*)    All other components within normal limits  COMPREHENSIVE METABOLIC PANEL - Abnormal; Notable for the following components:   Sodium 133 (*)    Glucose, Bld 126 (*)    Calcium 8.4 (*)    All other components within normal limits  CBC WITH DIFFERENTIAL/PLATELET  LACTIC ACID, PLASMA  LACTIC ACID, PLASMA  URINALYSIS, ROUTINE W REFLEX MICROSCOPIC    EKG None  Radiology CT Renal Stone Study  Result Date: 03/14/2020 CLINICAL DATA:  40 year old male with flank pain. Concern for kidney stone. EXAM: CT ABDOMEN AND PELVIS WITHOUT CONTRAST TECHNIQUE: Multidetector CT imaging of the abdomen and pelvis was performed following the standard protocol without IV contrast. COMPARISON:  CT abdomen pelvis dated 11/20/2014. FINDINGS: Evaluation of this exam is limited in the absence of intravenous contrast. Lower chest: The visualized lung bases are clear. No intra-abdominal free air or free fluid. Hepatobiliary: Diffuse fatty liver. No intrahepatic biliary dilatation. The gallbladder is unremarkable. Pancreas: Unremarkable. No pancreatic ductal dilatation or surrounding inflammatory changes. Spleen: Normal in size without focal abnormality. Adrenals/Urinary Tract: The adrenal glands unremarkable. The kidneys, visualized ureters, and urinary bladder appear unremarkable. Stomach/Bowel: There is no bowel obstruction or active inflammation. The appendix is normal. Vascular/Lymphatic: The abdominal aorta and IVC unremarkable. No portal venous gas. There is no adenopathy. Reproductive: The prostate and seminal vesicles are grossly unremarkable. No pelvic mass. Other: Small fat containing umbilical hernia. Musculoskeletal: No acute osseous pathology. IMPRESSION: 1. No acute intra-abdominal or pelvic pathology. No hydronephrosis or nephrolithiasis. 2. Fatty liver. Electronically Signed   By: Anner Crete M.D.   On: 03/14/2020 23:15    Procedures Procedures (including critical care time)  Medications Ordered in ED Medications  fentaNYL (SUBLIMAZE) injection 50 mcg (has no administration in time range)  ketorolac (TORADOL) 30 MG/ML injection 30 mg (30 mg Intravenous Given 03/14/20 2259)    ED Course  I have reviewed the triage vital signs and the nursing notes.  Pertinent labs & imaging results that were available during my care of the patient were reviewed by me and considered in my medical decision making (see chart for details).    MDM Rules/Calculators/A&P                         40 year old male with complaints of pilonidal abscess, fevers, chills, weakness x3 days with urinary urgency.  On arrival, he is alert, oriented, nontoxic-appearing, no acute distress, resting comfortably in the ER bed.  Borderline fever of 99.7 on arrival, no hypoxia, tachycardia or hypotension.  Physical exam with indurated pilonidal abscess not amenable to drainage.  Generalized low back tenderness on exam.  No significant abdominal tenderness.  DDx includes Covid/flu, viral syndrome, renal stone, UTI, spinal abscess  Labs ordered, reviewed and interpreted by me.  CBC without leukocytosis, normal hemoglobin.  CMP with mild hyponatremia 133, normal renal function and liver function test.  Ordered a CT renal to rule out kidney stone, which was negative.  No visible soft tissue changes noted in the lumbar spine area/pelvis.  Lactic acid normal. UA pending. Flu is positive.   Signed out care to Dr. Betsey Holiday who will oversee the patient's UA and Flu test and dispo accordingly. Suspect he will be stable for discharge, Augmentin ordered for treatment of pilonidal abscess.    Final Clinical Impression(s) / ED Diagnoses Final diagnoses:  Pilonidal abscess    Rx / DC Orders ED Discharge Orders         Ordered    doxycycline (VIBRAMYCIN) 100 MG capsule  2 times daily,   Status:  Discontinued         03/14/20 2345    ibuprofen (ADVIL) 800 MG tablet  3 times daily        03/14/20 2345    amoxicillin-clavulanate (AUGMENTIN) 875-125 MG tablet  Every 12 hours        03/14/20 2352

## 2020-03-14 NOTE — ED Notes (Signed)
Attending made aware of patients pain level.

## 2020-03-14 NOTE — ED Provider Notes (Signed)
[facility] EMERGENCY DEPARTMENT Provider Note   CSN: [id number] Arrival date & time: 03/14/20  2057     History Chief Complaint  Patient presents with  . Abscess    Paul Macdonald is a 40 y.o. male.  HPI 40 year old male with a history of depression, hyperglycemia, obesity, panic attacks, psoriasis, opioid dependence in remission presents to the ER with complaints of fevers, chills, feeling weak, bilateral back pain and an abscess on his buttocks.  Patient states he has a history of abscess there, states this popped up about a week ago.  States he started to develop some chills, and weakness over the last few days.  He went to urgent care and had a rapid test and PCR done for Covid and this was negative.  He is vaccinated.  States that he has felt like he has had more urgency when urinating, but is not urinating on himself.  No dysuria or hematuria.  States he has been urinating for longer periods of time.  He also reports some abdominal fullness.  Denies any numbness or tingling to his lower extremities.  No loss of bowel bladder control.  No prior history of kidney stones.    Past Medical History:  Diagnosis Date  . Depression     Patient Active Problem List   Diagnosis Date Noted  . Chest pain 09/23/2017  . Anxiety and depression 09/23/2017  . Obesity, Class III, BMI 40-49.9 (morbid obesity) (HCC) 09/23/2017  . Leukocytosis 09/23/2017  . Elevated ALT measurement 09/23/2017  . Tobacco abuse 09/23/2017  . Wheezing 09/23/2017  . Hyperglycemia 09/23/2017  . Increased glucose level 01/24/2016  . Opioid dependence in remission (HCC) 01/23/2016  . Panic attacks 01/23/2016  . Psoriasis 01/23/2016  . Syncope 01/23/2016    Past Surgical History:  Procedure Laterality Date  . 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 Heart attack Mother 35  . Diabetes Mother   .  Hypertension Mother   . Heart attack Father 56    Social History   Tobacco Use  . Smoking status: Current Every Day Smoker    Packs/day: 1.00    Types: Cigarettes  . Smokeless tobacco: Never Used  Substance Use Topics  . Alcohol use: No  . Drug use: No    Comment: opioid and heroin use in past- per recent [facility] hospital records    Home Medications Prior to Admission medications   Medication Sig Start Date End Date Taking? Authorizing Provider  amoxicillin-clavulanate (AUGMENTIN) 875-125 MG tablet Take 1 tablet by mouth every 12 (twelve) hours. 03/14/20  Yes Mare Ferrari, PA-C  ibuprofen (ADVIL) 800 MG tablet Take 1 tablet (800 mg total) by mouth 3 (three) times daily. 03/14/20  Yes Mare Ferrari, PA-C  Apremilast (OTEZLA) 30 MG TABS Take 30 mg by mouth 2 (two) times daily.    [provider]  aspirin EC 81 MG EC tablet Take 1 tablet (81 mg total) by mouth daily. 09/25/17   Rometta Emery, MD  HYDROcodone-acetaminophen (NORCO/VICODIN) 5-325 MG tablet Take 1 tablet by mouth every 6 (six) hours as needed for severe pain. 02/12/18   Henderly, Britni A, PA-C  hydrOXYzine (ATARAX/VISTARIL) 10 MG tablet Take 10 mg by mouth daily as needed for itching.    [provider]    Allergies    Patient  has no known allergies.  Review of Systems   Review of Systems  Constitutional: Negative for chills and fever.  HENT: Negative for ear pain and sore throat.   Eyes: Negative for pain and visual disturbance.  Respiratory: Negative for cough and shortness of breath.   Cardiovascular: Negative for chest pain and palpitations.  Gastrointestinal: Negative for abdominal pain, diarrhea and vomiting.  Genitourinary: Positive for urgency. Negative for decreased urine volume, dysuria, frequency and hematuria.  Musculoskeletal: Positive for back pain. Negative for arthralgias.  Skin: Positive for wound. Negative for color change and rash.  Neurological: Negative for seizures and  syncope.  All other systems reviewed and are negative.   Physical Exam Updated Vital Signs BP (!) 107/51   Pulse 82   Temp 98.2 F (36.8 C) (Oral)   Resp 16   SpO2 94%   Physical Exam Vitals and nursing note reviewed.  Constitutional:      General: He is not in acute distress.    Appearance: He is well-developed and well-nourished. He is not ill-appearing, toxic-appearing or diaphoretic.  HENT:     Head: Normocephalic and atraumatic.  Eyes:     Conjunctiva/sclera: Conjunctivae normal.  Cardiovascular:     Rate and Rhythm: Normal rate and regular rhythm.     Pulses: Normal pulses.     Heart sounds: Normal heart sounds. No murmur heard.   Pulmonary:     Effort: Pulmonary effort is normal. No respiratory distress.     Breath sounds: Normal breath sounds.  Abdominal:     Palpations: Abdomen is soft.     Tenderness: There is no abdominal tenderness. There is right CVA tenderness and left CVA tenderness.  Musculoskeletal:        General: Tenderness present. No edema. Normal range of motion.     Cervical back: Neck supple.     Right lower leg: No edema.     Left lower leg: No edema.     Comments: Patient with bilateral flank tenderness, paraspinal muscle tenderness on exam.  No C, T, L-spine tenderness.  Moving all 4 extremities without difficulty.  5/5 strength in upper and lower extremities bilaterally.  Neurovascularly intact.  Skin:    General: Skin is warm and dry.     Findings: Erythema present.     Comments: Skin exam consistent with pilonidal abscess.  Largely indurated, no central fluctuance.    Neurological:     General: No focal deficit present.     Mental Status: He is alert.     Sensory: No sensory deficit.     Motor: No weakness.  Psychiatric:        Mood and Affect: Mood and affect normal.     ED Results / Procedures / Treatments   Labs (all labs ordered are listed, but only abnormal results are displayed) Labs Reviewed  RAPID INFLUENZA A&B ANTIGENS -  Abnormal; Notable for the following components:      Result Value   Influenza A ([facility]) POSITIVE (*)    All other components within normal limits  COMPREHENSIVE METABOLIC PANEL - Abnormal; Notable for the following components:   Sodium 133 (*)    Glucose, Bld 126 (*)    Calcium 8.4 (*)    All other components within normal limits  CBC WITH DIFFERENTIAL/PLATELET  LACTIC ACID, PLASMA  LACTIC ACID, PLASMA  URINALYSIS, ROUTINE W REFLEX MICROSCOPIC    EKG None  Radiology CT Renal Stone Study  Result Date: 03/14/2020 CLINICAL DATA:  40 year old male with  flank pain. Concern for kidney stone. EXAM: CT ABDOMEN AND PELVIS WITHOUT CONTRAST TECHNIQUE: Multidetector CT imaging of the abdomen and pelvis was performed following the standard protocol without IV contrast. COMPARISON:  CT abdomen pelvis dated 11/20/2014. FINDINGS: Evaluation of this exam is limited in the absence of intravenous contrast. Lower chest: The visualized lung bases are clear. No intra-abdominal free air or free fluid. Hepatobiliary: Diffuse fatty liver. No intrahepatic biliary dilatation. The gallbladder is unremarkable. Pancreas: Unremarkable. No pancreatic ductal dilatation or surrounding inflammatory changes. Spleen: Normal in size without focal abnormality. Adrenals/Urinary Tract: The adrenal glands unremarkable. The kidneys, visualized ureters, and urinary bladder appear unremarkable. Stomach/Bowel: There is no bowel obstruction or active inflammation. The appendix is normal. Vascular/Lymphatic: The abdominal aorta and IVC unremarkable. No portal venous gas. There is no adenopathy. Reproductive: The prostate and seminal vesicles are grossly unremarkable. No pelvic mass. Other: Small fat containing umbilical hernia. Musculoskeletal: No acute osseous pathology. IMPRESSION: 1. No acute intra-abdominal or pelvic pathology. No hydronephrosis or nephrolithiasis. 2. Fatty liver. Electronically Signed   By: Elgie Collard M.D.   On:  03/14/2020 23:15    Procedures Procedures (including critical care time)  Medications Ordered in ED Medications  fentaNYL (SUBLIMAZE) injection 50 mcg (has no administration in time range)  ketorolac (TORADOL) 30 MG/ML injection 30 mg (30 mg Intravenous Given 03/14/20 2259)    ED Course  I have reviewed the triage vital signs and the nursing notes.  Pertinent labs & imaging results that were available during my care of the patient were reviewed by me and considered in my medical decision making (see chart for details).    MDM Rules/Calculators/A&P                         40 year old male with complaints of pilonidal abscess, fevers, chills, weakness x3 days with urinary urgency.  On arrival, he is alert, oriented, nontoxic-appearing, no acute distress, resting comfortably in the ER bed.  Borderline fever of 99.7 on arrival, no hypoxia, tachycardia or hypotension.  Physical exam with indurated pilonidal abscess not amenable to drainage.  Generalized low back tenderness on exam.  No significant abdominal tenderness.  DDx includes Covid/flu, viral syndrome, renal stone, UTI, spinal abscess  Labs ordered, reviewed and interpreted by me.  CBC without leukocytosis, normal hemoglobin.  CMP with mild hyponatremia 133, normal renal function and liver function test.  Ordered a CT renal to rule out kidney stone, which was negative.  No visible soft tissue changes noted in the lumbar spine area/pelvis.  Lactic acid normal. UA pending. Flu is positive.   Signed out care to Dr. Blinda Leatherwood who will oversee the patient's UA  And dispo accordingly. Suspect he will be stable for discharge, Augmentin ordered for treatment of pilonidal abscess.    Final Clinical Impression(s) / ED Diagnoses Final diagnoses:  Pilonidal abscess  Influenza A    Rx / DC Orders ED Discharge Orders         Ordered    doxycycline (VIBRAMYCIN) 100 MG capsule  2 times daily,   Status:  Discontinued        03/14/20 2345     ibuprofen (ADVIL) 800 MG tablet  3 times daily        03/14/20 2345    amoxicillin-clavulanate (AUGMENTIN) 875-125 MG tablet  Every 12 hours        03/14/20 2352           Mare Ferrari, PA-C 03/15/20 0010  Charlesetta Shanks, MD 03/16/20 [phone]

## 2020-03-14 NOTE — ED Triage Notes (Signed)
C/o bodyaches x 3 days , abscess to buttocks x 3 days, neg covid test today

## 2020-03-15 LAB — RAPID INFLUENZA A&B ANTIGENS
Influenza A (ARMC): POSITIVE — AB
Influenza B (ARMC): NEGATIVE

## 2020-03-15 LAB — URINALYSIS, ROUTINE W REFLEX MICROSCOPIC
Bilirubin Urine: NEGATIVE
Glucose, UA: NEGATIVE mg/dL
Hgb urine dipstick: NEGATIVE
Ketones, ur: NEGATIVE mg/dL
Leukocytes,Ua: NEGATIVE
Nitrite: NEGATIVE
Protein, ur: NEGATIVE mg/dL
Specific Gravity, Urine: 1.015 (ref 1.005–1.030)
pH: 6.5 (ref 5.0–8.0)
